# Patient Record
Sex: Female | Born: 1945 | Race: Black or African American | Hispanic: No
Health system: Southern US, Community
[De-identification: ages and names within clinical notes are randomized; demographics above are authoritative.]

---

## 2018-04-03 ENCOUNTER — Inpatient Hospital Stay (HOSPITAL_COMMUNITY)
Admission: EM | Admit: 2018-04-03 | Discharge: 2018-05-19 | DRG: 957 | Disposition: E | Payer: Medicare Other | Attending: Student | Admitting: Student

## 2018-04-03 ENCOUNTER — Emergency Department (HOSPITAL_COMMUNITY): Payer: Medicare Other

## 2018-04-03 DIAGNOSIS — E876 Hypokalemia: Secondary | ICD-10-CM | POA: Diagnosis not present

## 2018-04-03 DIAGNOSIS — S82112A Displaced fracture of left tibial spine, initial encounter for closed fracture: Secondary | ICD-10-CM

## 2018-04-03 DIAGNOSIS — Z9289 Personal history of other medical treatment: Secondary | ICD-10-CM

## 2018-04-03 DIAGNOSIS — S42101A Fracture of unspecified part of scapula, right shoulder, initial encounter for closed fracture: Secondary | ICD-10-CM

## 2018-04-03 DIAGNOSIS — J155 Pneumonia due to Escherichia coli: Secondary | ICD-10-CM | POA: Diagnosis present

## 2018-04-03 DIAGNOSIS — J90 Pleural effusion, not elsewhere classified: Secondary | ICD-10-CM

## 2018-04-03 DIAGNOSIS — S2241XA Multiple fractures of ribs, right side, initial encounter for closed fracture: Secondary | ICD-10-CM

## 2018-04-03 DIAGNOSIS — R40242 Glasgow coma scale score 9-12, unspecified time: Secondary | ICD-10-CM | POA: Diagnosis not present

## 2018-04-03 DIAGNOSIS — S52502A Unspecified fracture of the lower end of left radius, initial encounter for closed fracture: Secondary | ICD-10-CM | POA: Diagnosis present

## 2018-04-03 DIAGNOSIS — S066X9A Traumatic subarachnoid hemorrhage with loss of consciousness of unspecified duration, initial encounter: Secondary | ICD-10-CM | POA: Diagnosis not present

## 2018-04-03 DIAGNOSIS — R739 Hyperglycemia, unspecified: Secondary | ICD-10-CM | POA: Diagnosis present

## 2018-04-03 DIAGNOSIS — S2243XA Multiple fractures of ribs, bilateral, initial encounter for closed fracture: Secondary | ICD-10-CM

## 2018-04-03 DIAGNOSIS — G960 Cerebrospinal fluid leak: Secondary | ICD-10-CM | POA: Diagnosis not present

## 2018-04-03 DIAGNOSIS — Z9911 Dependence on respirator [ventilator] status: Secondary | ICD-10-CM

## 2018-04-03 DIAGNOSIS — T884XXA Failed or difficult intubation, initial encounter: Secondary | ICD-10-CM

## 2018-04-03 DIAGNOSIS — S82142A Displaced bicondylar fracture of left tibia, initial encounter for closed fracture: Secondary | ICD-10-CM

## 2018-04-03 DIAGNOSIS — I609 Nontraumatic subarachnoid hemorrhage, unspecified: Secondary | ICD-10-CM

## 2018-04-03 DIAGNOSIS — S12100A Unspecified displaced fracture of second cervical vertebra, initial encounter for closed fracture: Secondary | ICD-10-CM | POA: Diagnosis present

## 2018-04-03 DIAGNOSIS — S82141A Displaced bicondylar fracture of right tibia, initial encounter for closed fracture: Secondary | ICD-10-CM

## 2018-04-03 DIAGNOSIS — F209 Schizophrenia, unspecified: Secondary | ICD-10-CM | POA: Diagnosis present

## 2018-04-03 DIAGNOSIS — R402212 Coma scale, best verbal response, none, at arrival to emergency department: Secondary | ICD-10-CM | POA: Diagnosis present

## 2018-04-03 DIAGNOSIS — J942 Hemothorax: Secondary | ICD-10-CM

## 2018-04-03 DIAGNOSIS — E870 Hyperosmolality and hypernatremia: Secondary | ICD-10-CM | POA: Diagnosis not present

## 2018-04-03 DIAGNOSIS — J9601 Acute respiratory failure with hypoxia: Secondary | ICD-10-CM | POA: Diagnosis present

## 2018-04-03 DIAGNOSIS — R402312 Coma scale, best motor response, none, at arrival to emergency department: Secondary | ICD-10-CM | POA: Diagnosis present

## 2018-04-03 DIAGNOSIS — T148XXA Other injury of unspecified body region, initial encounter: Secondary | ICD-10-CM

## 2018-04-03 DIAGNOSIS — Z66 Do not resuscitate: Secondary | ICD-10-CM | POA: Diagnosis not present

## 2018-04-03 DIAGNOSIS — Z515 Encounter for palliative care: Secondary | ICD-10-CM | POA: Diagnosis not present

## 2018-04-03 DIAGNOSIS — D62 Acute posthemorrhagic anemia: Secondary | ICD-10-CM | POA: Diagnosis present

## 2018-04-03 DIAGNOSIS — S52202A Unspecified fracture of shaft of left ulna, initial encounter for closed fracture: Secondary | ICD-10-CM

## 2018-04-03 DIAGNOSIS — I10 Essential (primary) hypertension: Secondary | ICD-10-CM | POA: Diagnosis present

## 2018-04-03 DIAGNOSIS — S82841A Displaced bimalleolar fracture of right lower leg, initial encounter for closed fracture: Secondary | ICD-10-CM

## 2018-04-03 DIAGNOSIS — I6529 Occlusion and stenosis of unspecified carotid artery: Secondary | ICD-10-CM | POA: Diagnosis present

## 2018-04-03 DIAGNOSIS — Z978 Presence of other specified devices: Secondary | ICD-10-CM

## 2018-04-03 DIAGNOSIS — R402112 Coma scale, eyes open, never, at arrival to emergency department: Secondary | ICD-10-CM | POA: Diagnosis present

## 2018-04-03 DIAGNOSIS — S272XXA Traumatic hemopneumothorax, initial encounter: Secondary | ICD-10-CM | POA: Diagnosis present

## 2018-04-03 DIAGNOSIS — S12000A Unspecified displaced fracture of first cervical vertebra, initial encounter for closed fracture: Secondary | ICD-10-CM

## 2018-04-03 DIAGNOSIS — D696 Thrombocytopenia, unspecified: Secondary | ICD-10-CM | POA: Diagnosis present

## 2018-04-03 DIAGNOSIS — Z0189 Encounter for other specified special examinations: Secondary | ICD-10-CM

## 2018-04-03 DIAGNOSIS — S52612A Displaced fracture of left ulna styloid process, initial encounter for closed fracture: Secondary | ICD-10-CM | POA: Diagnosis present

## 2018-04-03 DIAGNOSIS — Z452 Encounter for adjustment and management of vascular access device: Secondary | ICD-10-CM

## 2018-04-03 DIAGNOSIS — S82152A Displaced fracture of left tibial tuberosity, initial encounter for closed fracture: Secondary | ICD-10-CM | POA: Diagnosis present

## 2018-04-03 DIAGNOSIS — R339 Retention of urine, unspecified: Secondary | ICD-10-CM | POA: Diagnosis not present

## 2018-04-03 DIAGNOSIS — S0181XA Laceration without foreign body of other part of head, initial encounter: Secondary | ICD-10-CM | POA: Diagnosis present

## 2018-04-03 DIAGNOSIS — D734 Cyst of spleen: Secondary | ICD-10-CM | POA: Diagnosis present

## 2018-04-03 DIAGNOSIS — L8990 Pressure ulcer of unspecified site, unspecified stage: Secondary | ICD-10-CM

## 2018-04-03 DIAGNOSIS — Z419 Encounter for procedure for purposes other than remedying health state, unspecified: Secondary | ICD-10-CM

## 2018-04-03 DIAGNOSIS — S0101XA Laceration without foreign body of scalp, initial encounter: Secondary | ICD-10-CM | POA: Diagnosis present

## 2018-04-03 DIAGNOSIS — J9690 Respiratory failure, unspecified, unspecified whether with hypoxia or hypercapnia: Secondary | ICD-10-CM

## 2018-04-03 DIAGNOSIS — J918 Pleural effusion in other conditions classified elsewhere: Secondary | ICD-10-CM | POA: Diagnosis not present

## 2018-04-03 DIAGNOSIS — S065X9A Traumatic subdural hemorrhage with loss of consciousness of unspecified duration, initial encounter: Secondary | ICD-10-CM | POA: Diagnosis present

## 2018-04-03 DIAGNOSIS — S62605A Fracture of unspecified phalanx of left ring finger, initial encounter for closed fracture: Secondary | ICD-10-CM | POA: Diagnosis present

## 2018-04-03 DIAGNOSIS — Z23 Encounter for immunization: Secondary | ICD-10-CM

## 2018-04-03 LAB — CBC
HCT: 32.3 % — ABNORMAL LOW (ref 36.0–46.0)
Hemoglobin: 9.5 g/dL — ABNORMAL LOW (ref 12.0–15.0)
MCH: 27.1 pg (ref 26.0–34.0)
MCHC: 29.4 g/dL — ABNORMAL LOW (ref 30.0–36.0)
MCV: 92.3 fL (ref 80.0–100.0)
Platelets: 220 10*3/uL (ref 150–400)
RBC: 3.5 MIL/uL — ABNORMAL LOW (ref 3.87–5.11)
RDW: 15.5 % (ref 11.5–15.5)
WBC: 16.1 10*3/uL — ABNORMAL HIGH (ref 4.0–10.5)
nRBC: 0 % (ref 0.0–0.2)

## 2018-04-03 LAB — I-STAT CHEM 8, ED
BUN: 24 mg/dL — AB (ref 8–23)
Calcium, Ion: 1.06 mmol/L — ABNORMAL LOW (ref 1.15–1.40)
Chloride: 110 mmol/L (ref 98–111)
Creatinine, Ser: 1.5 mg/dL — ABNORMAL HIGH (ref 0.44–1.00)
GLUCOSE: 162 mg/dL — AB (ref 70–99)
HCT: 30 % — ABNORMAL LOW (ref 36.0–46.0)
Hemoglobin: 10.2 g/dL — ABNORMAL LOW (ref 12.0–15.0)
Potassium: 3.5 mmol/L (ref 3.5–5.1)
Sodium: 143 mmol/L (ref 135–145)
TCO2: 21 mmol/L — ABNORMAL LOW (ref 22–32)

## 2018-04-03 LAB — ABO/RH: ABO/RH(D): O POS

## 2018-04-03 LAB — I-STAT CG4 LACTIC ACID, ED: Lactic Acid, Venous: 3.79 mmol/L (ref 0.5–1.9)

## 2018-04-03 LAB — PROTIME-INR
INR: 1.27
Prothrombin Time: 15.7 seconds — ABNORMAL HIGH (ref 11.4–15.2)

## 2018-04-03 MED ORDER — FENTANYL CITRATE (PF) 100 MCG/2ML IJ SOLN
INTRAMUSCULAR | Status: AC | PRN
  Administered 2018-04-03: 100 ug via INTRAVENOUS

## 2018-04-03 MED ORDER — ETOMIDATE 2 MG/ML IV SOLN
INTRAVENOUS | Status: AC | PRN
  Administered 2018-04-03: 30 mg via INTRAVENOUS

## 2018-04-03 MED ORDER — SODIUM CHLORIDE 0.9 % IV SOLN
INTRAVENOUS | Status: AC | PRN
  Administered 2018-04-03: 1000 mL via INTRAVENOUS

## 2018-04-03 MED ORDER — SUCCINYLCHOLINE CHLORIDE 20 MG/ML IJ SOLN
INTRAMUSCULAR | Status: AC | PRN
  Administered 2018-04-03: 150 mg via INTRAVENOUS

## 2018-04-03 MED ORDER — FENTANYL CITRATE (PF) 100 MCG/2ML IJ SOLN
INTRAMUSCULAR | Status: AC
  Filled 2018-04-03: qty 4

## 2018-04-03 MED ORDER — IOHEXOL 300 MG/ML  SOLN
100.0000 mL | Freq: Once | INTRAMUSCULAR | Status: AC | PRN
  Administered 2018-04-03: 100 mL via INTRAVENOUS

## 2018-04-03 MED ORDER — TETANUS-DIPHTH-ACELL PERTUSSIS 5-2.5-18.5 LF-MCG/0.5 IM SUSP
0.5000 mL | Freq: Once | INTRAMUSCULAR | Status: AC
  Administered 2018-04-04: 0.5 mL via INTRAMUSCULAR
  Filled 2018-04-03: qty 0.5

## 2018-04-03 MED ORDER — PROPOFOL 1000 MG/100ML IV EMUL
INTRAVENOUS | Status: AC | PRN
  Administered 2018-04-03: 20 ug/kg/min via INTRAVENOUS

## 2018-04-03 MED ORDER — MIDAZOLAM HCL 5 MG/5ML IJ SOLN
INTRAMUSCULAR | Status: AC | PRN
  Administered 2018-04-03: 2 mg via INTRAVENOUS

## 2018-04-03 MED ORDER — MIDAZOLAM HCL 2 MG/2ML IJ SOLN
INTRAMUSCULAR | Status: AC
  Filled 2018-04-03: qty 4

## 2018-04-03 MED ORDER — PROPOFOL 1000 MG/100ML IV EMUL
INTRAVENOUS | Status: AC
  Filled 2018-04-03: qty 100

## 2018-04-03 NOTE — Procedures (Signed)
Central Venous Catheter Insertion Procedure Note Renee [name] [id number] 21-Nov-1945  Procedure: Insertion of Central Venous Catheter Indications: emergent IV access  Procedure Details Consent: emergency Time Out: Verified patient identification, verified procedure, site/side was marked, verified correct patient position, special equipment/implants available, medications/allergies/relevent history reviewed, required imaging and test results available.  Performed  Maximum sterile technique was used including antiseptics and gloves. Skin prep: Chlorhexidine; local anesthetic administered A antimicrobial bonded/coated triple lumen catheter was placed in the right femoral vein due to emergent situation using the Seldinger technique.  Evaluation Blood flow good Complications: No apparent complications Patient did tolerate procedure well. Chest X-ray ordered to verify placement.  CXR: not needed.  Axel Filler 03/28/2018, 11:18 PM
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
## 2018-04-03 NOTE — ED Notes (Signed)
Notified Dr. Little of elevated lactic acid 

## 2018-04-04 ENCOUNTER — Emergency Department (HOSPITAL_COMMUNITY): Payer: Medicare Other

## 2018-04-04 ENCOUNTER — Inpatient Hospital Stay: Payer: Self-pay

## 2018-04-04 ENCOUNTER — Inpatient Hospital Stay (HOSPITAL_COMMUNITY): Payer: Medicare Other

## 2018-04-04 ENCOUNTER — Encounter (HOSPITAL_COMMUNITY): Payer: Self-pay | Admitting: Emergency Medicine

## 2018-04-04 DIAGNOSIS — R402312 Coma scale, best motor response, none, at arrival to emergency department: Secondary | ICD-10-CM | POA: Diagnosis present

## 2018-04-04 DIAGNOSIS — S52612A Displaced fracture of left ulna styloid process, initial encounter for closed fracture: Secondary | ICD-10-CM | POA: Diagnosis present

## 2018-04-04 DIAGNOSIS — S272XXA Traumatic hemopneumothorax, initial encounter: Secondary | ICD-10-CM | POA: Diagnosis present

## 2018-04-04 DIAGNOSIS — Z9911 Dependence on respirator [ventilator] status: Secondary | ICD-10-CM | POA: Diagnosis not present

## 2018-04-04 DIAGNOSIS — S82152A Displaced fracture of left tibial tuberosity, initial encounter for closed fracture: Secondary | ICD-10-CM | POA: Diagnosis present

## 2018-04-04 DIAGNOSIS — E870 Hyperosmolality and hypernatremia: Secondary | ICD-10-CM | POA: Diagnosis not present

## 2018-04-04 DIAGNOSIS — S82142A Displaced bicondylar fracture of left tibia, initial encounter for closed fracture: Secondary | ICD-10-CM | POA: Diagnosis present

## 2018-04-04 DIAGNOSIS — S12000A Unspecified displaced fracture of first cervical vertebra, initial encounter for closed fracture: Secondary | ICD-10-CM | POA: Diagnosis present

## 2018-04-04 DIAGNOSIS — R402112 Coma scale, eyes open, never, at arrival to emergency department: Secondary | ICD-10-CM | POA: Diagnosis present

## 2018-04-04 DIAGNOSIS — J918 Pleural effusion in other conditions classified elsewhere: Secondary | ICD-10-CM | POA: Diagnosis not present

## 2018-04-04 DIAGNOSIS — S065X9A Traumatic subdural hemorrhage with loss of consciousness of unspecified duration, initial encounter: Secondary | ICD-10-CM | POA: Diagnosis present

## 2018-04-04 DIAGNOSIS — S12100A Unspecified displaced fracture of second cervical vertebra, initial encounter for closed fracture: Secondary | ICD-10-CM | POA: Diagnosis present

## 2018-04-04 DIAGNOSIS — G960 Cerebrospinal fluid leak: Secondary | ICD-10-CM | POA: Diagnosis not present

## 2018-04-04 DIAGNOSIS — J155 Pneumonia due to Escherichia coli: Secondary | ICD-10-CM | POA: Diagnosis present

## 2018-04-04 DIAGNOSIS — S52202A Unspecified fracture of shaft of left ulna, initial encounter for closed fracture: Secondary | ICD-10-CM | POA: Diagnosis present

## 2018-04-04 DIAGNOSIS — S066X9A Traumatic subarachnoid hemorrhage with loss of consciousness of unspecified duration, initial encounter: Secondary | ICD-10-CM | POA: Diagnosis present

## 2018-04-04 DIAGNOSIS — J9601 Acute respiratory failure with hypoxia: Secondary | ICD-10-CM | POA: Diagnosis present

## 2018-04-04 DIAGNOSIS — S2243XA Multiple fractures of ribs, bilateral, initial encounter for closed fracture: Secondary | ICD-10-CM | POA: Diagnosis present

## 2018-04-04 DIAGNOSIS — Z515 Encounter for palliative care: Secondary | ICD-10-CM | POA: Diagnosis not present

## 2018-04-04 DIAGNOSIS — Z66 Do not resuscitate: Secondary | ICD-10-CM | POA: Diagnosis not present

## 2018-04-04 DIAGNOSIS — S82112A Displaced fracture of left tibial spine, initial encounter for closed fracture: Secondary | ICD-10-CM | POA: Diagnosis present

## 2018-04-04 DIAGNOSIS — D62 Acute posthemorrhagic anemia: Secondary | ICD-10-CM | POA: Diagnosis present

## 2018-04-04 DIAGNOSIS — Z23 Encounter for immunization: Secondary | ICD-10-CM | POA: Diagnosis present

## 2018-04-04 DIAGNOSIS — S52502A Unspecified fracture of the lower end of left radius, initial encounter for closed fracture: Secondary | ICD-10-CM | POA: Diagnosis present

## 2018-04-04 LAB — BPAM FFP
Blood Product Expiration Date: 202001262359
Blood Product Expiration Date: 202001272359
Blood Product Expiration Date: 202001282359
Blood Product Expiration Date: 202001282359
ISSUE DATE / TIME: 202001152304
ISSUE DATE / TIME: 202001152304
ISSUE DATE / TIME: 202001152222
ISSUE DATE / TIME: 202001152222
UNIT TYPE AND RH: 6200
Unit Type and Rh: 600
Unit Type and Rh: 6200
Unit Type and Rh: 6200

## 2018-04-04 LAB — CBC
HCT: 32.2 % — ABNORMAL LOW (ref 36.0–46.0)
HCT: 29.4 % — ABNORMAL LOW (ref 36.0–46.0)
Hemoglobin: 10.1 g/dL — ABNORMAL LOW (ref 12.0–15.0)
Hemoglobin: 9.6 g/dL — ABNORMAL LOW (ref 12.0–15.0)
MCH: 27.8 pg (ref 26.0–34.0)
MCH: 28.5 pg (ref 26.0–34.0)
MCHC: 31.4 g/dL (ref 30.0–36.0)
MCHC: 32.7 g/dL (ref 30.0–36.0)
MCV: 88.7 fL (ref 80.0–100.0)
MCV: 87.2 fL (ref 80.0–100.0)
PLATELETS: 135 10*3/uL — AB (ref 150–400)
Platelets: 123 10*3/uL — ABNORMAL LOW (ref 150–400)
RBC: 3.63 MIL/uL — AB (ref 3.87–5.11)
RBC: 3.37 MIL/uL — ABNORMAL LOW (ref 3.87–5.11)
RDW: 15.6 % — ABNORMAL HIGH (ref 11.5–15.5)
RDW: 16.1 % — ABNORMAL HIGH (ref 11.5–15.5)
WBC: 8.7 10*3/uL (ref 4.0–10.5)
WBC: 13.8 10*3/uL — ABNORMAL HIGH (ref 4.0–10.5)
nRBC: 0 % (ref 0.0–0.2)
nRBC: 0 % (ref 0.0–0.2)

## 2018-04-04 LAB — PREPARE FRESH FROZEN PLASMA
Unit division: 0
Unit division: 0
Unit division: 0
Unit division: 0

## 2018-04-04 LAB — COMPREHENSIVE METABOLIC PANEL
ALBUMIN: 2.8 g/dL — AB (ref 3.5–5.0)
ALT: 90 U/L — ABNORMAL HIGH (ref 0–44)
AST: 142 U/L — ABNORMAL HIGH (ref 15–41)
Alkaline Phosphatase: 59 U/L (ref 38–126)
Anion gap: 12 (ref 5–15)
BUN: 23 mg/dL (ref 8–23)
CO2: 20 mmol/L — AB (ref 22–32)
Calcium: 7.9 mg/dL — ABNORMAL LOW (ref 8.9–10.3)
Chloride: 110 mmol/L (ref 98–111)
Creatinine, Ser: 1.24 mg/dL — ABNORMAL HIGH (ref 0.44–1.00)
GFR calc Af Amer: 50 mL/min — ABNORMAL LOW (ref >60)
GFR calc non Af Amer: 43 mL/min — ABNORMAL LOW (ref >60)
Glucose, Bld: 172 mg/dL — ABNORMAL HIGH (ref 70–99)
Potassium: 3.7 mmol/L (ref 3.5–5.1)
SODIUM: 142 mmol/L (ref 135–145)
Total Bilirubin: 0.5 mg/dL (ref 0.3–1.2)
Total Protein: 5.8 g/dL — ABNORMAL LOW (ref 6.5–8.1)

## 2018-04-04 LAB — BASIC METABOLIC PANEL
Anion gap: 14 (ref 5–15)
BUN: 20 mg/dL (ref 8–23)
CHLORIDE: 115 mmol/L — AB (ref 98–111)
CO2: 18 mmol/L — ABNORMAL LOW (ref 22–32)
Calcium: 7.6 mg/dL — ABNORMAL LOW (ref 8.9–10.3)
Creatinine, Ser: 1.14 mg/dL — ABNORMAL HIGH (ref 0.44–1.00)
GFR calc Af Amer: 55 mL/min — ABNORMAL LOW (ref >60)
GFR calc non Af Amer: 48 mL/min — ABNORMAL LOW (ref >60)
Glucose, Bld: 182 mg/dL — ABNORMAL HIGH (ref 70–99)
POTASSIUM: 3 mmol/L — AB (ref 3.5–5.1)
Sodium: 147 mmol/L — ABNORMAL HIGH (ref 135–145)

## 2018-04-04 LAB — URINALYSIS, ROUTINE W REFLEX MICROSCOPIC
Bilirubin Urine: NEGATIVE
Glucose, UA: NEGATIVE mg/dL
Ketones, ur: NEGATIVE mg/dL
Nitrite: NEGATIVE
PROTEIN: 30 mg/dL — AB
Specific Gravity, Urine: 1.009 (ref 1.005–1.030)
WBC, UA: >50 WBC/hpf — ABNORMAL HIGH (ref 0–5)
pH: 6 (ref 5.0–8.0)

## 2018-04-04 LAB — GLUCOSE, CAPILLARY
GLUCOSE-CAPILLARY: 112 mg/dL — AB (ref 70–99)
GLUCOSE-CAPILLARY: 135 mg/dL — AB (ref 70–99)
Glucose-Capillary: 121 mg/dL — ABNORMAL HIGH (ref 70–99)
Glucose-Capillary: 121 mg/dL — ABNORMAL HIGH (ref 70–99)
Glucose-Capillary: 153 mg/dL — ABNORMAL HIGH (ref 70–99)
Glucose-Capillary: 143 mg/dL — ABNORMAL HIGH (ref 70–99)

## 2018-04-04 LAB — I-STAT ARTERIAL BLOOD GAS, ED
Acid-base deficit: 7 mmol/L — ABNORMAL HIGH (ref 0.0–2.0)
Bicarbonate: 20 mmol/L (ref 20.0–28.0)
O2 Saturation: 97 %
Patient temperature: 35.8
TCO2: 21 mmol/L — ABNORMAL LOW (ref 22–32)
pCO2 arterial: 44.3 mmHg (ref 32.0–48.0)
pH, Arterial: 7.256 — ABNORMAL LOW (ref 7.350–7.450)
pO2, Arterial: 95 mmHg (ref 83.0–108.0)

## 2018-04-04 LAB — CDS SEROLOGY

## 2018-04-04 LAB — MRSA PCR SCREENING: MRSA by PCR: POSITIVE — AB

## 2018-04-04 LAB — ETHANOL: Alcohol, Ethyl (B): 219 mg/dL — ABNORMAL HIGH (ref <10)

## 2018-04-04 LAB — TRIGLYCERIDES: Triglycerides: 181 mg/dL — ABNORMAL HIGH (ref <150)

## 2018-04-04 LAB — BLOOD PRODUCT ORDER (VERBAL) VERIFICATION

## 2018-04-04 MED ORDER — SODIUM CHLORIDE 0.9% FLUSH
10.0000 mL | Freq: Two times a day (BID) | INTRAVENOUS | Status: DC
  Administered 2018-04-04: 20 mL
  Administered 2018-04-05 – 2018-04-11 (×9): 10 mL
  Administered 2018-04-13: 20 mL
  Administered 2018-04-15 – 2018-04-17 (×3): 10 mL
  Administered 2018-04-18: 20 mL
  Administered 2018-04-18 – 2018-04-24 (×9): 10 mL
  Administered 2018-04-25 (×2): 20 mL

## 2018-04-04 MED ORDER — DEXTROSE-NACL 5-0.9 % IV SOLN
INTRAVENOUS | Status: DC
  Administered 2018-04-04: 04:00:00 via INTRAVENOUS

## 2018-04-04 MED ORDER — SODIUM CHLORIDE 0.9 % IV SOLN
INTRAVENOUS | Status: DC
  Administered 2018-04-04: 10:00:00 via INTRAVENOUS
  Filled 2018-04-04 (×4): qty 1000

## 2018-04-04 MED ORDER — CHLORHEXIDINE GLUCONATE CLOTH 2 % EX PADS: 6.0000 | MEDICATED_PAD | Freq: Every morning | CUTANEOUS | Status: DC

## 2018-04-04 MED ORDER — CHLORHEXIDINE GLUCONATE 0.12% ORAL RINSE (MEDLINE KIT)
15.0000 mL | Freq: Two times a day (BID) | OROMUCOSAL | Status: DC
  Administered 2018-04-04 – 2018-04-26 (×45): 15 mL via OROMUCOSAL

## 2018-04-04 MED ORDER — SODIUM CHLORIDE 0.9 % IV BOLUS
1000.0000 mL | Freq: Once | INTRAVENOUS | Status: AC
  Administered 2018-04-04: 1000 mL via INTRAVENOUS

## 2018-04-04 MED ORDER — ORAL CARE MOUTH RINSE
15.0000 mL | OROMUCOSAL | Status: DC
  Administered 2018-04-04 – 2018-04-26 (×220): 15 mL via OROMUCOSAL

## 2018-04-04 MED ORDER — ACETAMINOPHEN 160 MG/5ML PO SOLN
650.0000 mg | Freq: Four times a day (QID) | ORAL | Status: DC | PRN
  Administered 2018-04-04 – 2018-04-25 (×13): 650 mg
  Filled 2018-04-04 (×15): qty 20.3

## 2018-04-04 MED ORDER — VANCOMYCIN HCL IN DEXTROSE 1-5 GM/200ML-% IV SOLN
1000.0000 mg | INTRAVENOUS | Status: AC
  Administered 2018-04-05: 1000 mg via INTRAVENOUS
  Filled 2018-04-04: qty 200

## 2018-04-04 MED ORDER — SODIUM CHLORIDE 0.9 % IV SOLN
Freq: Once | INTRAVENOUS | Status: AC
  Administered 2018-04-04: 1000 mL via INTRAVENOUS

## 2018-04-04 MED ORDER — CEFAZOLIN SODIUM-DEXTROSE 1-4 GM/50ML-% IV SOLN
1.0000 g | Freq: Three times a day (TID) | INTRAVENOUS | Status: DC
  Administered 2018-04-04 – 2018-04-05 (×4): 1 g via INTRAVENOUS
  Administered 2018-04-05: 2 g via INTRAVENOUS
  Filled 2018-04-04 (×8): qty 50

## 2018-04-04 MED ORDER — METOPROLOL TARTRATE 5 MG/5ML IV SOLN
5.0000 mg | Freq: Four times a day (QID) | INTRAVENOUS | Status: DC
  Administered 2018-04-04 – 2018-04-08 (×16): 5 mg via INTRAVENOUS
  Filled 2018-04-04 (×15): qty 5

## 2018-04-04 MED ORDER — PROPOFOL 1000 MG/100ML IV EMUL
5.0000 ug/kg/min | INTRAVENOUS | Status: DC
  Administered 2018-04-04: 20 ug/kg/min via INTRAVENOUS
  Administered 2018-04-04: 15 ug/kg/min via INTRAVENOUS
  Administered 2018-04-04: 20 ug/kg/min via INTRAVENOUS
  Administered 2018-04-05: 25 ug/kg/min via INTRAVENOUS
  Administered 2018-04-05: 10 ug/kg/min via INTRAVENOUS
  Administered 2018-04-06: 25 ug/kg/min via INTRAVENOUS
  Filled 2018-04-04 (×5): qty 100

## 2018-04-04 MED ORDER — METOPROLOL TARTRATE 5 MG/5ML IV SOLN
5.0000 mg | Freq: Once | INTRAVENOUS | Status: AC
  Administered 2018-04-04: 5 mg via INTRAVENOUS
  Filled 2018-04-04: qty 5

## 2018-04-04 MED ORDER — SODIUM CHLORIDE 0.9% FLUSH: 10.0000 mL | INTRAVENOUS | Status: DC | PRN

## 2018-04-04 MED ORDER — CHLORHEXIDINE GLUCONATE CLOTH 2 % EX PADS
6.0000 | MEDICATED_PAD | Freq: Every day | CUTANEOUS | Status: DC
  Administered 2018-04-05 – 2018-04-26 (×24): 6 via TOPICAL

## 2018-04-04 MED ORDER — MUPIROCIN 2 % EX OINT
1.0000 "application " | TOPICAL_OINTMENT | Freq: Two times a day (BID) | CUTANEOUS | Status: AC
  Administered 2018-04-04 – 2018-04-08 (×10): 1 via NASAL
  Filled 2018-04-04: qty 22

## 2018-04-04 MED ORDER — POTASSIUM CHLORIDE 20 MEQ/15ML (10%) PO SOLN
40.0000 meq | Freq: Two times a day (BID) | ORAL | Status: AC
  Administered 2018-04-04 (×2): 40 meq
  Filled 2018-04-04 (×2): qty 30

## 2018-04-04 MED ORDER — POTASSIUM CHLORIDE IN NACL 20-0.9 MEQ/L-% IV SOLN
INTRAVENOUS | Status: DC
  Administered 2018-04-04 – 2018-04-07 (×6): via INTRAVENOUS
  Filled 2018-04-04 (×9): qty 1000

## 2018-04-04 MED ORDER — INSULIN ASPART 100 UNIT/ML ~~LOC~~ SOLN
0.0000 [IU] | SUBCUTANEOUS | Status: DC
  Administered 2018-04-04 (×2): 2 [IU] via SUBCUTANEOUS
  Administered 2018-04-04: 3 [IU] via SUBCUTANEOUS
  Administered 2018-04-05 – 2018-04-06 (×4): 2 [IU] via SUBCUTANEOUS
  Administered 2018-04-06: 5 [IU] via SUBCUTANEOUS
  Administered 2018-04-06 – 2018-04-07 (×2): 2 [IU] via SUBCUTANEOUS
  Administered 2018-04-07: 3 [IU] via SUBCUTANEOUS
  Administered 2018-04-07 – 2018-04-08 (×4): 2 [IU] via SUBCUTANEOUS
  Administered 2018-04-08: 3 [IU] via SUBCUTANEOUS
  Administered 2018-04-08: 2 [IU] via SUBCUTANEOUS
  Administered 2018-04-09: 3 [IU] via SUBCUTANEOUS
  Administered 2018-04-09: 2 [IU] via SUBCUTANEOUS
  Administered 2018-04-09: 3 [IU] via SUBCUTANEOUS
  Administered 2018-04-09 – 2018-04-10 (×5): 2 [IU] via SUBCUTANEOUS
  Administered 2018-04-10: 3 [IU] via SUBCUTANEOUS
  Administered 2018-04-10 – 2018-04-11 (×3): 2 [IU] via SUBCUTANEOUS
  Administered 2018-04-11 (×2): 3 [IU] via SUBCUTANEOUS
  Administered 2018-04-11: 2 [IU] via SUBCUTANEOUS
  Administered 2018-04-11 – 2018-04-12 (×2): 5 [IU] via SUBCUTANEOUS
  Administered 2018-04-12 (×2): 2 [IU] via SUBCUTANEOUS
  Administered 2018-04-12: 3 [IU] via SUBCUTANEOUS
  Administered 2018-04-12 – 2018-04-13 (×3): 2 [IU] via SUBCUTANEOUS
  Administered 2018-04-13 (×2): 3 [IU] via SUBCUTANEOUS
  Administered 2018-04-13: 2 [IU] via SUBCUTANEOUS
  Administered 2018-04-13: 3 [IU] via SUBCUTANEOUS
  Administered 2018-04-13 – 2018-04-14 (×3): 2 [IU] via SUBCUTANEOUS
  Administered 2018-04-14: 3 [IU] via SUBCUTANEOUS
  Administered 2018-04-14: 2 [IU] via SUBCUTANEOUS
  Administered 2018-04-14: 3 [IU] via SUBCUTANEOUS
  Administered 2018-04-14 – 2018-04-15 (×3): 2 [IU] via SUBCUTANEOUS
  Administered 2018-04-15 (×2): 3 [IU] via SUBCUTANEOUS
  Administered 2018-04-16 (×2): 2 [IU] via SUBCUTANEOUS
  Administered 2018-04-16: 5 [IU] via SUBCUTANEOUS
  Administered 2018-04-16: 2 [IU] via SUBCUTANEOUS
  Administered 2018-04-16 – 2018-04-17 (×4): 3 [IU] via SUBCUTANEOUS
  Administered 2018-04-17: 2 [IU] via SUBCUTANEOUS
  Administered 2018-04-17 (×3): 3 [IU] via SUBCUTANEOUS
  Administered 2018-04-18: 2 [IU] via SUBCUTANEOUS
  Administered 2018-04-18 (×2): 3 [IU] via SUBCUTANEOUS
  Administered 2018-04-18: 2 [IU] via SUBCUTANEOUS
  Administered 2018-04-18: 3 [IU] via SUBCUTANEOUS
  Administered 2018-04-19 (×2): 2 [IU] via SUBCUTANEOUS
  Administered 2018-04-19 – 2018-04-20 (×5): 3 [IU] via SUBCUTANEOUS
  Administered 2018-04-20: 5 [IU] via SUBCUTANEOUS
  Administered 2018-04-21 (×3): 2 [IU] via SUBCUTANEOUS
  Administered 2018-04-21 (×2): 3 [IU] via SUBCUTANEOUS
  Administered 2018-04-22 (×2): 2 [IU] via SUBCUTANEOUS
  Administered 2018-04-22: 3 [IU] via SUBCUTANEOUS
  Administered 2018-04-22: 2 [IU] via SUBCUTANEOUS
  Administered 2018-04-22 (×2): 3 [IU] via SUBCUTANEOUS
  Administered 2018-04-23: 2 [IU] via SUBCUTANEOUS
  Administered 2018-04-23: 3 [IU] via SUBCUTANEOUS
  Administered 2018-04-23 (×2): 2 [IU] via SUBCUTANEOUS
  Administered 2018-04-23: 1 [IU] via SUBCUTANEOUS
  Administered 2018-04-24 (×2): 2 [IU] via SUBCUTANEOUS
  Administered 2018-04-25: 3 [IU] via SUBCUTANEOUS
  Administered 2018-04-25 – 2018-04-26 (×7): 2 [IU] via SUBCUTANEOUS

## 2018-04-04 NOTE — ED Notes (Signed)
Family updated as to patient's status.
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
## 2018-04-04 NOTE — Progress Notes (Signed)
Peripherally Inserted Central Catheter/Midline Placement  The IV Nurse has discussed with the patient and/or persons authorized to consent for the patient, the purpose of this procedure and the potential benefits and risks involved with this procedure.  The benefits include less needle sticks, lab draws from the catheter, and the patient may be discharged home with the catheter. Risks include, but not limited to, infection, bleeding, blood clot (thrombus formation), and puncture of an artery; nerve damage and irregular heartbeat and possibility to perform a PICC exchange if needed/ordered by physician.  Alternatives to this procedure were also discussed.  Bard Power PICC patient education guide, fact sheet on infection prevention and patient information card has been provided to patient /or left at bedside.    PICC/Midline Placement Documentation  PICC Double Lumen 04/04/18 PICC Right Basilic 48 cm 3 cm (Active)  Indication for Insertion or Continuance of Line Poor Vasculature-patient has had multiple peripheral attempts or PIVs lasting less than 24 hours 04/04/2018  2:00 PM  Exposed Catheter (cm) 3 cm 04/04/2018  2:00 PM  Site Assessment Clean;Dry;Intact 04/04/2018  2:00 PM  Lumen #1 Status Flushed;Blood return noted 04/04/2018  2:00 PM  Lumen #2 Status Flushed;Blood return noted 04/04/2018  2:00 PM  Dressing Type Transparent 04/04/2018  2:00 PM  Dressing Status Clean;Dry;Intact;Antimicrobial disc in place 04/04/2018  2:00 PM  Dressing Change Due 04/11/18 04/04/2018  2:00 PM       Stacie Glaze Horton 04/04/2018, 2:44 PM

## 2018-04-04 NOTE — Progress Notes (Signed)
Patient ID: Renee [name], female   DOB: 01/10/1946, 73 y.o.   MRN: [id number] Follow up - Trauma Critical Care  Patient Details:    Renee [name] is an 73 y.o. female.  Lines/tubes : Airway 7.5 mm (Active)  Secured at (cm) 25 cm Apr 15, 2018 10:45 PM  Measured From Teeth 15-Apr-2018 10:45 PM  Secured Location Center 2018-04-15 10:45 PM  Secured By [employer] 2018-04-15 10:45 PM  Site Condition Cool;Dry 2018-04-15 10:45 PM     Airway 7.5 mm (Active)  Secured at (cm) 26 cm 04/04/2018  3:38 AM  Measured From Lips 04/04/2018  3:38 AM  Secured Location Left 04/04/2018  3:38 AM  Secured By [employer] 04/04/2018  3:38 AM  Tube Holder Repositioned Yes 04/04/2018  3:38 AM  Cuff Pressure (cm H2O) 26 cm H2O 04/04/2018  3:38 AM  Site Condition Dry 04/04/2018  3:38 AM     CVC Triple Lumen 04/15/2018 Right Femoral (Active)  Indication for Insertion or Continuance of Line Poor Vasculature-patient has had multiple peripheral attempts or PIVs lasting less than 24 hours 04/04/2018  7:37 AM  Site Assessment Clean;Dry;Intact 04/04/2018  3:00 AM  Proximal Lumen Status Flushed;Infusing 04/04/2018  3:00 AM  Medial Lumen Status Flushed;Infusing 04/04/2018  3:00 AM  Distal Lumen Status Flushed;Saline locked 04/04/2018  3:00 AM  Dressing Type Transparent 04/04/2018  3:00 AM  Dressing Status Clean;Dry;Intact 04/04/2018  3:00 AM  Line Care Connections checked and tightened 04/04/2018  6:00 AM  Dressing Intervention Dressing changed;Antimicrobial disc changed 04/04/2018  6:00 AM  Dressing Change Due 04/11/18 04/04/2018  6:00 AM     NG/OG Tube Orogastric 18 Fr. Right mouth Xray (Active)  Site Assessment Clean;Dry;Intact 04/04/2018  3:00 AM  Ongoing Placement Verification No change in cm markings or external length of tube from initial placement;No change in respiratory status;No acute changes, not attributed to clinical condition 04/04/2018  3:00 AM  Status Clamped 04/04/2018  3:00 AM     Urethral  Catheter Rodman Pickle, EMT Latex;Temperature probe;Straight-tip 14 Fr. (Active)  Indication for Insertion or Continuance of Catheter Unstable critical patients (first 24-48 hours) 04/04/2018  7:38 AM  Site Assessment Clean;Intact 04/04/2018  3:00 AM  Catheter Maintenance Bag below level of bladder;Catheter secured;Drainage bag/tubing not touching floor;Insertion date on drainage bag;No dependent loops;Seal intact 04/04/2018  7:38 AM  Collection Container Standard drainage bag 04/04/2018  3:00 AM  Securement Method Securing device (Describe) 04/04/2018  3:00 AM  Urinary Catheter Interventions Unclamped 04/04/2018  3:00 AM  Output (mL) 200 mL 04/04/2018  6:00 AM    Microbiology/Sepsis markers: Results for orders placed or performed during the hospital encounter of 15-Apr-2018  MRSA PCR Screening     Status: Abnormal   Collection Time: 04/04/18  3:18 AM  Result Value Ref Range Status   MRSA by PCR POSITIVE (A) NEGATIVE Final    Comment:        The GeneXpert MRSA Assay (FDA approved for NASAL specimens only), is one component of a comprehensive MRSA colonization surveillance program. It is not intended to diagnose MRSA infection nor to guide or monitor treatment for MRSA infections. RESULT CALLED TO, READ BACK BY AND VERIFIED WITH: [name] RN [id number] 04/04/18 [name] Performed at [facility], [address] [place], [place] [address]     Anti-infectives:  Anti-infectives (From admission, onward)   None      Consults: Treatment Team:  Samson Frederic, MD Tia Alert, MD Haddix, Gillie Manners, MD  Studies:    Events:  Subjective:    Overnight Issues:   Objective:  Vital signs for last 24 hours: Temp:  [95.7 F (35.4 C)-101.5 F (38.6 C)] 101.5 F (38.6 C) (01/16 0700) Pulse Rate:  [75-125] 100 (01/16 0700) Resp:  [9-27] 19 (01/16 0700) BP: (73-184)/(39-98) 147/62 (01/16 0700) SpO2:  [62 %-100 %] 100 % (01/16 0700) FiO2 (%):  [60 %-80 %] 60 % (01/16 0338) Weight:   [81.6 kg] 81.6 kg (01/15 2330)  Hemodynamic parameters for last 24 hours:    Intake/Output from previous day: 01/15 0701 - 01/16 0700 In: 5427.4 [I.V.:3181.1; Blood:1233; IV Piggyback:721.3] Out: 1100 [Urine:1100]  Intake/Output this shift: No intake/output data recorded.  Vent settings for last 24 hours: Vent Mode: PRVC FiO2 (%):  [60 %-80 %] 60 % Set Rate:  [16 bmp] 16 bmp Vt Set:  [550 mL] 550 mL PEEP:  [5 cmH20] 5 cmH20 Plateau Pressure:  [14 cmH20-15 cmH20] 14 cmH20  Physical Exam:  General: on vent Neuro: PERL, follows some commands HEENT/Neck: ETT and collar Resp: clear to auscultation bilaterally CVS: RRR GI: soft, nontender, BS WNL, no r/g Extremities: BLE KI  Results for orders placed or performed during the hospital encounter of 03/25/2018 (from the past 24 hour(s))  Prepare fresh frozen plasma     Status: None (Preliminary result)   Collection Time: 03/20/2018 10:21 PM  Result Value Ref Range   Unit Number [id number]    Blood Component Type LIQ PLASMA    Unit division 00    Status of Unit ISSUED    Unit tag comment EMERGENCY RELEASE LITTLE    Transfusion Status OK TO TRANSFUSE    Unit Number [id number]    Blood Component Type LIQ PLASMA    Unit division 00    Status of Unit ISSUED    Unit tag comment EMERGENCY RELEASE LITTLE    Transfusion Status OK TO TRANSFUSE    Unit Number [id number]    Blood Component Type LIQ PLASMA    Unit division 00    Status of Unit REL FROM [facility]    Transfusion Status OK TO TRANSFUSE    Unit Number [id number]    Blood Component Type LIQ PLASMA    Unit division 00    Status of Unit REL FROM [facility]    Transfusion Status OK TO TRANSFUSE   CDS serology     Status: None   Collection Time: 03/25/2018 10:36 PM  Result Value Ref Range   CDS serology specimen      SPECIMEN WILL BE HELD FOR 14 DAYS IF TESTING IS REQUIRED  Comprehensive metabolic panel     Status: Abnormal   Collection Time: 04/09/2018 10:36 PM  Result  Value Ref Range   Sodium 142 135 - 145 mmol/L   Potassium 3.7 3.5 - 5.1 mmol/L   Chloride 110 98 - 111 mmol/L   CO2 20 (L) 22 - 32 mmol/L   Glucose, Bld 172 (H) 70 - 99 mg/dL   BUN 23 8 - 23 mg/dL   Creatinine, Ser 7.25 (H) 0.44 - 1.00 mg/dL   Calcium 7.9 (L) 8.9 - 10.3 mg/dL   Total Protein 5.8 (L) 6.5 - 8.1 g/dL   Albumin 2.8 (L) 3.5 - 5.0 g/dL   AST 366 (H) 15 - 41 U/L   ALT 90 (H) 0 - 44 U/L   Alkaline Phosphatase 59 38 - 126 U/L   Total Bilirubin 0.5 0.3 - 1.2 mg/dL   GFR calc non Af Denyse Dago  43 (L) >60 mL/min   GFR calc Af Amer 50 (L) >60 mL/min   Anion gap 12 5 - 15  CBC     Status: Abnormal   Collection Time: 03/31/2018 10:36 PM  Result Value Ref Range   WBC 16.1 (H) 4.0 - 10.5 K/uL   RBC 3.50 (L) 3.87 - 5.11 MIL/uL   Hemoglobin 9.5 (L) 12.0 - 15.0 g/dL   HCT 40.932.3 (L) 81.136.0 - 91.446.0 %   MCV 92.3 80.0 - 100.0 fL   MCH 27.1 26.0 - 34.0 pg   MCHC 29.4 (L) 30.0 - 36.0 g/dL   RDW 78.215.5 95.611.5 - 21.315.5 %   Platelets 220 150 - 400 K/uL   nRBC 0.0 0.0 - 0.2 %  Ethanol     Status: Abnormal   Collection Time: 03/21/2018 10:36 PM  Result Value Ref Range   Alcohol, Ethyl (B) 219 (H) <10 mg/dL  Protime-INR     Status: Abnormal   Collection Time: 04/13/2018 10:36 PM  Result Value Ref Range   Prothrombin Time 15.7 (H) 11.4 - 15.2 seconds   INR 1.27   Type and screen Ordered by PROVIDER DEFAULT     Status: None (Preliminary result)   Collection Time: 04/12/2018 10:44 PM  Result Value Ref Range   ABO/RH(D) O POS    Antibody Screen NEG    Sample Expiration 04/06/2018    Unit Number [id number]    Blood Component Type RBC LR PHER1    Unit division 00    Status of Unit ISSUED    Unit tag comment EMERGENCY RELEASE LITTLE    Transfusion Status OK TO TRANSFUSE    Crossmatch Result COMPATIBLE    Unit Number [id number]    Blood Component Type RBC LR PHER2    Unit division 00    Status of Unit ISSUED    Unit tag comment EMERGENCY RELEASE LITTLE    Transfusion Status OK TO TRANSFUSE     Crossmatch Result COMPATIBLE    Unit Number [id number]    Blood Component Type RBC LR PHER2    Unit division 00    Status of [facility]    Unit tag comment EMERGENCY RELEASE    Transfusion Status OK TO TRANSFUSE    Crossmatch Result COMPATIBLE    Unit Number [id number]    Blood Component Type RED CELLS,LR    Unit division 00    Status of Unit ISSUED    Unit tag comment EMERGENCY RELEASE    Transfusion Status OK TO TRANSFUSE    Crossmatch Result COMPATIBLE   ABO/Rh     Status: None   Collection Time: 03/24/2018 10:44 PM  Result Value Ref Range   ABO/RH(D)      O POS Performed at [facility], [address] [place], [place] [address]   I-Stat Chem 8, ED     Status: Abnormal   Collection Time: 04/07/2018 10:54 PM  Result Value Ref Range   Sodium 143 135 - 145 mmol/L   Potassium 3.5 3.5 - 5.1 mmol/L   Chloride 110 98 - 111 mmol/L   BUN 24 (H) 8 - 23 mg/dL   Creatinine, Ser 4.161.50 (H) 0.44 - 1.00 mg/dL   Glucose, Bld 606162 (H) 70 - 99 mg/dL   Calcium, Ion 3.011.06 (L) 1.15 - 1.40 mmol/L   TCO2 21 (L) 22 - 32 mmol/L   Hemoglobin 10.2 (L) 12.0 - 15.0 g/dL   HCT 60.130.0 (L) 09.336.0 - 23.546.0 %  I-Stat  CG4 Lactic Acid, ED     Status: Abnormal   Collection Time: 03/28/2018 10:55 PM  Result Value Ref Range   Lactic Acid, Venous 3.79 (HH) 0.5 - 1.9 mmol/L   Comment NOTIFIED PHYSICIAN   I-Stat arterial blood gas, ED     Status: Abnormal   Collection Time: 04/04/18 12:03 AM  Result Value Ref Range   pH, Arterial 7.256 (L) 7.350 - 7.450   pCO2 arterial 44.3 32.0 - 48.0 mmHg   pO2, Arterial 95.0 83.0 - 108.0 mmHg   Bicarbonate 20.0 20.0 - 28.0 mmol/L   TCO2 21 (L) 22 - 32 mmol/L   O2 Saturation 97.0 %   Acid-base deficit 7.0 (H) 0.0 - 2.0 mmol/L   Patient temperature 35.8 C    Collection site RADIAL, ALLEN'S TEST ACCEPTABLE    Drawn by Operator    Sample type ARTERIAL   Urinalysis, Routine w reflex microscopic     Status: Abnormal   Collection Time: 04/04/18 12:05 AM  Result  Value Ref Range   Color, Urine YELLOW YELLOW   APPearance CLOUDY (A) CLEAR   Specific Gravity, Urine 1.009 1.005 - 1.030   pH 6.0 5.0 - 8.0   Glucose, UA NEGATIVE NEGATIVE mg/dL   Hgb urine dipstick LARGE (A) NEGATIVE   Bilirubin Urine NEGATIVE NEGATIVE   Ketones, ur NEGATIVE NEGATIVE mg/dL   Protein, ur 30 (A) NEGATIVE mg/dL   Nitrite NEGATIVE NEGATIVE   Leukocytes, UA LARGE (A) NEGATIVE   RBC / HPF 21-50 0 - 5 RBC/hpf   WBC, UA >50 (H) 0 - 5 WBC/hpf   Bacteria, UA RARE (A) NONE SEEN   WBC Clumps PRESENT   MRSA PCR Screening     Status: Abnormal   Collection Time: 04/04/18  3:18 AM  Result Value Ref Range   MRSA by PCR POSITIVE (A) NEGATIVE  Glucose, capillary     Status: Abnormal   Collection Time: 04/04/18  3:23 AM  Result Value Ref Range   Glucose-Capillary 121 (H) 70 - 99 mg/dL  CBC     Status: Abnormal   Collection Time: 04/04/18  5:38 AM  Result Value Ref Range   WBC 8.7 4.0 - 10.5 K/uL   RBC 3.63 (L) 3.87 - 5.11 MIL/uL   Hemoglobin 10.1 (L) 12.0 - 15.0 g/dL   HCT 16.1 (L) 09.6 - 04.5 %   MCV 88.7 80.0 - 100.0 fL   MCH 27.8 26.0 - 34.0 pg   MCHC 31.4 30.0 - 36.0 g/dL   RDW 40.9 (H) 81.1 - 91.4 %   Platelets 135 (L) 150 - 400 K/uL   nRBC 0.0 0.0 - 0.2 %  Basic metabolic panel     Status: Abnormal   Collection Time: 04/04/18  5:38 AM  Result Value Ref Range   Sodium 147 (H) 135 - 145 mmol/L   Potassium 3.0 (L) 3.5 - 5.1 mmol/L   Chloride 115 (H) 98 - 111 mmol/L   CO2 18 (L) 22 - 32 mmol/L   Glucose, Bld 182 (H) 70 - 99 mg/dL   BUN 20 8 - 23 mg/dL   Creatinine, Ser 7.82 (H) 0.44 - 1.00 mg/dL   Calcium 7.6 (L) 8.9 - 10.3 mg/dL   GFR calc non Af Amer 48 (L) >60 mL/min   GFR calc Af Amer 55 (L) >60 mL/min   Anion gap 14 5 - 15  Glucose, capillary     Status: Abnormal   Collection Time: 04/04/18  7:48 AM  Result Value Ref  Range   Glucose-Capillary 121 (H) 70 - 99 mg/dL   Comment 1 Notify RN    Comment 2 Document in Chart     Assessment & Plan: Present on  Admission: **None**    LOS: 0 days   Additional comments:I reviewed the patient's new clinical lab test results. [facility] Dr. Yetta BarreJones, plan F/U CT head in AM C1-2 FXs - collar per Dr. Yetta BarreJones B rib FX (R 2-9; L2, 4-7) with small R PTX Acute hypoxic ventilator dependent respiratory failure - full support P neuro stability L ulna and 4th finger FX - per Dr. Orlan Leavensrtman R scapula FX - per Dr. Jena GaussHaddix B tibial plateau FX - to OR tomorrow with Dr. Jena GaussHaddix R fibula FX - per Dr. Jena GaussHaddix R bimal ankle FX - per Dr. Jena GaussHaddix Hyperglycemia - SSI, change IVF HX schizophrenia per family VTE - try plexipulse, no Lovenox yet with TBI FEN - replete hypokalemia Dispo - ICU   Critical Care Total Time*: 45 Minutes  Violeta GelinasBurke Emony Dormer, MD, MPH, FACS Trauma: [phone] General Surgery: [phone]  04/04/2018  *Care during the described time interval was provided by me. I have reviewed this patient's available data, including medical history, events of note, physical examination and test results as part of my evaluation.
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
## 2018-04-04 NOTE — Anesthesia Preprocedure Evaluation (Addendum)
Anesthesia Evaluation  General Assessment Comment:Pt Sedated and intubated  Reviewed: NPO status , Patient's Chart, lab work & pertinent test results, Unable to perform ROS - Chart review only  Airway Mallampati: Intubated  TM Distance: >3 FB Neck ROM: Full    Dental no notable dental hx.    Pulmonary    Pulmonary exam normal breath sounds clear to auscultation       Cardiovascular Normal cardiovascular exam Rhythm:Regular Rate:Normal     Neuro/Psych negative neurological ROS     GI/Hepatic negative GI ROS, Neg liver ROS, PUD: ],   Endo/Other  negative endocrine ROS  Renal/GU negative Renal ROS     Musculoskeletal   Abdominal   Peds  Hematology  (+) anemia , Hgb 8.6   Anesthesia Other Findings   Reproductive/Obstetrics                            Lab Results  Component Value Date   WBC 10.0 04/11/2018   HGB 8.6 (L) 03/29/2018   HCT 27.2 (L) 03/20/2018   MCV 88.6 03/24/2018   PLT 106 (L) 04/13/2018   Lab Results  Component Value Date   CREATININE 1.14 (H) 04/18/2018   BUN 17 03/25/2018   NA 145 03/31/2018   K 4.0 04/18/2018   CL 118 (H) 04/07/2018   CO2 17 (L) 04/15/2018     Anesthesia Physical Anesthesia Plan  ASA: III  Anesthesia Plan: General   Post-op Pain Management:    Induction: Intravenous  PONV Risk Score and Plan: 4 or greater and Treatment may vary due to age or medical condition, Ondansetron and Dexamethasone  Airway Management Planned: Oral ETT  Additional Equipment:   Intra-op Plan:   Post-operative Plan: Post-operative intubation/ventilation  Informed Consent:     Dental advisory given  Plan Discussed with: CRNA, Surgeon and Anesthesiologist  Anesthesia Plan Comments:         Anesthesia Quick Evaluation
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
## 2018-04-04 NOTE — Progress Notes (Signed)
Patient transported to and from CT without any apparent complications. 

## 2018-04-04 NOTE — ED Provider Notes (Addendum)
LACERATION REPAIR Performed by: Barkley Boards Consent: Emergency consult applied; patient is sedated and intubated Risks and benefits: risks, benefits and alternatives were not discussed; see above Patient identity confirmed: wristband Time out performed prior to procedure Wound explored Laceration Location: Scalp Laceration Length: 5 cm No Foreign Bodies seen or palpated Anesthesia: local infiltration Local anesthetic: None; Patient is sedated and intubated  Anesthetic total: 0 ml Irrigation method: Pressure Amount of cleaning: standard Skin closure: Staples Number of sutures or staples: 6 Technique: Staples Patient tolerance: Patient tolerated the procedure well with no immediate complications.  LACERATION REPAIR Performed by: Barkley Boards Consent: Emergency consult applied; patient is sedated and intubated Risks and benefits: risks, benefits and alternatives were not discussed; see above Patient identity confirmed: wristband Time out performed prior to procedure Wound explored Laceration Location: Right eye Laceration Length: 1.5 cm No Foreign Bodies seen or palpated Anesthesia: local infiltration Local anesthetic: N/A; Patient is sedated and intubated Anesthetic total: 0 ml Irrigation method: Pressure Amount of cleaning: standard Skin closure: Sutures - 6.0 Prolene Number of sutures or staples: 1 Technique: Horizontal mattress Patient tolerance: Patient tolerated the procedure well with no immediate complications.    Barkley Boards, PA-C 04/04/18 0150    Barkley Boards, PA-C 04/04/18 0152    Glynn Octave, MD 04/04/18 [phone]

## 2018-04-04 NOTE — Procedures (Signed)
Intubation Procedure Note Renee [name] [id number] [birth year]  Procedure: Intubation Indications: Airway protection and maintenance  Procedure Details Consent: Unable to obtain consent because of altered level of consciousness. Time Out: Verified patient identification, verified procedure, site/side was marked, verified correct patient position, special equipment/implants available, medications/allergies/relevent history reviewed, required imaging and test results available.  Performed 2242  Maximum sterile technique was used including gloves, gown and hand hygiene.  MAC and 3    Evaluation Hemodynamic Status: BP stable throughout; O2 sats: stable throughout Patient's Current Condition: stable Complications: No apparent complications Patient did tolerate procedure well. Chest X-ray ordered to verify placement.  CXR: tube position acceptable.   Milinda Cave 04/04/2018
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
## 2018-04-05 ENCOUNTER — Inpatient Hospital Stay (HOSPITAL_COMMUNITY): Payer: Medicare Other

## 2018-04-05 ENCOUNTER — Inpatient Hospital Stay (HOSPITAL_COMMUNITY): Payer: Medicare Other | Admitting: Certified Registered Nurse Anesthetist

## 2018-04-05 ENCOUNTER — Encounter (HOSPITAL_COMMUNITY): Admission: EM | Disposition: E | Payer: Self-pay | Source: Home / Self Care

## 2018-04-05 HISTORY — PX: ORIF TIBIA PLATEAU: SHX2132

## 2018-04-05 HISTORY — PX: ORIF ANKLE FRACTURE: SHX5408

## 2018-04-05 HISTORY — PX: ORIF WRIST FRACTURE: SHX2133

## 2018-04-05 LAB — BASIC METABOLIC PANEL
ANION GAP: 10 (ref 5–15)
BUN: 17 mg/dL (ref 8–23)
CO2: 17 mmol/L — ABNORMAL LOW (ref 22–32)
Calcium: 7.5 mg/dL — ABNORMAL LOW (ref 8.9–10.3)
Chloride: 118 mmol/L — ABNORMAL HIGH (ref 98–111)
Creatinine, Ser: 1.14 mg/dL — ABNORMAL HIGH (ref 0.44–1.00)
GFR calc Af Amer: 55 mL/min — ABNORMAL LOW (ref >60)
GFR calc non Af Amer: 48 mL/min — ABNORMAL LOW (ref >60)
Glucose, Bld: 132 mg/dL — ABNORMAL HIGH (ref 70–99)
Potassium: 4 mmol/L (ref 3.5–5.1)
Sodium: 145 mmol/L (ref 135–145)

## 2018-04-05 LAB — CBC
HCT: 27.2 % — ABNORMAL LOW (ref 36.0–46.0)
Hemoglobin: 8.6 g/dL — ABNORMAL LOW (ref 12.0–15.0)
MCH: 28 pg (ref 26.0–34.0)
MCHC: 31.6 g/dL (ref 30.0–36.0)
MCV: 88.6 fL (ref 80.0–100.0)
Platelets: 106 10*3/uL — ABNORMAL LOW (ref 150–400)
RBC: 3.07 MIL/uL — ABNORMAL LOW (ref 3.87–5.11)
RDW: 16.5 % — ABNORMAL HIGH (ref 11.5–15.5)
WBC: 10 10*3/uL (ref 4.0–10.5)
nRBC: 0 % (ref 0.0–0.2)

## 2018-04-05 LAB — POCT I-STAT 7, (LYTES, BLD GAS, ICA,H+H)
Acid-base deficit: 3 mmol/L — ABNORMAL HIGH (ref 0.0–2.0)
Bicarbonate: 22.5 mmol/L (ref 20.0–28.0)
Calcium, Ion: 0.96 mmol/L — ABNORMAL LOW (ref 1.15–1.40)
HCT: 21 % — ABNORMAL LOW (ref 36.0–46.0)
Hemoglobin: 7.1 g/dL — ABNORMAL LOW (ref 12.0–15.0)
O2 Saturation: 99 %
PO2 ART: 148 mmHg — AB (ref 83.0–108.0)
Patient temperature: 35.4
Potassium: 4.1 mmol/L (ref 3.5–5.1)
Sodium: 148 mmol/L — ABNORMAL HIGH (ref 135–145)
TCO2: 24 mmol/L (ref 22–32)
pCO2 arterial: 38.7 mmHg (ref 32.0–48.0)
pH, Arterial: 7.364 (ref 7.350–7.450)

## 2018-04-05 LAB — GLUCOSE, CAPILLARY
GLUCOSE-CAPILLARY: 114 mg/dL — AB (ref 70–99)
Glucose-Capillary: 143 mg/dL — ABNORMAL HIGH (ref 70–99)
Glucose-Capillary: 135 mg/dL — ABNORMAL HIGH (ref 70–99)
Glucose-Capillary: 127 mg/dL — ABNORMAL HIGH (ref 70–99)
Glucose-Capillary: 99 mg/dL (ref 70–99)

## 2018-04-05 LAB — URINE CULTURE: Culture: NO GROWTH

## 2018-04-05 LAB — POCT I-STAT EG7
Acid-base deficit: 14 mmol/L — ABNORMAL HIGH (ref 0.0–2.0)
Bicarbonate: 12.9 mmol/L — ABNORMAL LOW (ref 20.0–28.0)
Calcium, Ion: 0.75 mmol/L — CL (ref 1.15–1.40)
HEMATOCRIT: 19 % — AB (ref 36.0–46.0)
Hemoglobin: 6.5 g/dL — CL (ref 12.0–15.0)
O2 SAT: 88 %
Patient temperature: 35.6
Potassium: 3 mmol/L — ABNORMAL LOW (ref 3.5–5.1)
Sodium: 151 mmol/L — ABNORMAL HIGH (ref 135–145)
TCO2: 14 mmol/L — AB (ref 22–32)
pCO2, Ven: 31.1 mmHg — ABNORMAL LOW (ref 44.0–60.0)
pH, Ven: 7.219 — ABNORMAL LOW (ref 7.250–7.430)
pO2, Ven: 59 mmHg — ABNORMAL HIGH (ref 32.0–45.0)

## 2018-04-05 LAB — PREPARE RBC (CROSSMATCH)

## 2018-04-05 SURGERY — OPEN REDUCTION INTERNAL FIXATION (ORIF) TIBIAL PLATEAU
Anesthesia: General | Laterality: Right

## 2018-04-05 MED ORDER — ROCURONIUM BROMIDE 50 MG/5ML IV SOSY
PREFILLED_SYRINGE | INTRAVENOUS | Status: AC
  Filled 2018-04-05: qty 10

## 2018-04-05 MED ORDER — LACTATED RINGERS IV SOLN
INTRAVENOUS | Status: DC | PRN
  Administered 2018-04-05 (×2): via INTRAVENOUS

## 2018-04-05 MED ORDER — ALBUMIN HUMAN 5 % IV SOLN
INTRAVENOUS | Status: DC | PRN
  Administered 2018-04-05 (×2): via INTRAVENOUS

## 2018-04-05 MED ORDER — FENTANYL CITRATE (PF) 250 MCG/5ML IJ SOLN
INTRAMUSCULAR | Status: AC
  Filled 2018-04-05: qty 5

## 2018-04-05 MED ORDER — EPHEDRINE 5 MG/ML INJ
INTRAVENOUS | Status: AC
  Filled 2018-04-05: qty 20

## 2018-04-05 MED ORDER — CEFAZOLIN SODIUM-DEXTROSE 2-4 GM/100ML-% IV SOLN: 2.0000 g | INTRAVENOUS | Status: DC

## 2018-04-05 MED ORDER — ROCURONIUM BROMIDE 100 MG/10ML IV SOLN
INTRAVENOUS | Status: DC | PRN
  Administered 2018-04-05 (×3): 50 mg via INTRAVENOUS

## 2018-04-05 MED ORDER — VANCOMYCIN HCL 1000 MG IV SOLR
INTRAVENOUS | Status: DC | PRN
  Administered 2018-04-05 (×3): 1000 mg via TOPICAL

## 2018-04-05 MED ORDER — POVIDONE-IODINE 10 % EX SWAB: 2.0000 "application " | Freq: Once | CUTANEOUS | Status: DC

## 2018-04-05 MED ORDER — PHENYLEPHRINE 40 MCG/ML (10ML) SYRINGE FOR IV PUSH (FOR BLOOD PRESSURE SUPPORT)
PREFILLED_SYRINGE | INTRAVENOUS | Status: DC | PRN
  Administered 2018-04-05: 120 ug via INTRAVENOUS

## 2018-04-05 MED ORDER — SODIUM BICARBONATE 8.4 % IV SOLN
INTRAVENOUS | Status: DC | PRN
  Administered 2018-04-05: 50 mL via INTRAVENOUS

## 2018-04-05 MED ORDER — FENTANYL BOLUS VIA INFUSION
50.0000 ug | INTRAVENOUS | Status: DC
  Administered 2018-04-06 – 2018-04-07 (×5): 50 ug via INTRAVENOUS
  Filled 2018-04-05 (×23): qty 50

## 2018-04-05 MED ORDER — FENTANYL 2500MCG IN NS 250ML (10MCG/ML) PREMIX INFUSION
0.0000 ug/h | INTRAVENOUS | Status: DC
  Administered 2018-04-05 – 2018-04-06 (×2): 100 ug/h via INTRAVENOUS
  Administered 2018-04-07: 75 ug/h via INTRAVENOUS
  Administered 2018-04-09: 50 ug/h via INTRAVENOUS
  Administered 2018-04-09: 200 ug/h via INTRAVENOUS
  Administered 2018-04-10 (×2): 100 ug/h via INTRAVENOUS
  Administered 2018-04-11: 75 ug/h via INTRAVENOUS
  Administered 2018-04-11: 35 ug/h via INTRAVENOUS
  Administered 2018-04-13: 50 ug/h via INTRAVENOUS
  Administered 2018-04-14 – 2018-04-15 (×2): 150 ug/h via INTRAVENOUS
  Administered 2018-04-16: 100 ug/h via INTRAVENOUS
  Administered 2018-04-17: 50 ug/h via INTRAVENOUS
  Administered 2018-04-18 – 2018-04-20 (×2): 75 ug/h via INTRAVENOUS
  Administered 2018-04-21: 50 ug/h via INTRAVENOUS
  Administered 2018-04-22: 200 ug/h via INTRAVENOUS
  Administered 2018-04-23: 75 ug/h via INTRAVENOUS
  Administered 2018-04-24: 50 ug/h via INTRAVENOUS
  Administered 2018-04-26: 75 ug/h via INTRAVENOUS
  Filled 2018-04-05 (×19): qty 250

## 2018-04-05 MED ORDER — PROPOFOL 500 MG/50ML IV EMUL
INTRAVENOUS | Status: DC | PRN
  Administered 2018-04-05: 100 ug/kg/min via INTRAVENOUS

## 2018-04-05 MED ORDER — 0.9 % SODIUM CHLORIDE (POUR BTL) OPTIME
TOPICAL | Status: DC | PRN
  Administered 2018-04-05: 1000 mL

## 2018-04-05 MED ORDER — PHENYLEPHRINE 40 MCG/ML (10ML) SYRINGE FOR IV PUSH (FOR BLOOD PRESSURE SUPPORT)
PREFILLED_SYRINGE | INTRAVENOUS | Status: AC
  Filled 2018-04-05: qty 30

## 2018-04-05 MED ORDER — HYDRALAZINE HCL 20 MG/ML IJ SOLN
10.0000 mg | INTRAMUSCULAR | Status: DC | PRN
  Administered 2018-04-06 – 2018-04-21 (×10): 10 mg via INTRAVENOUS
  Filled 2018-04-05 (×11): qty 1

## 2018-04-05 MED ORDER — SUCCINYLCHOLINE CHLORIDE 200 MG/10ML IV SOSY
PREFILLED_SYRINGE | INTRAVENOUS | Status: AC
  Filled 2018-04-05: qty 10

## 2018-04-05 MED ORDER — SODIUM CHLORIDE 0.9% IV SOLUTION: Freq: Once | INTRAVENOUS | Status: DC

## 2018-04-05 MED ORDER — CEFAZOLIN SODIUM-DEXTROSE 2-4 GM/100ML-% IV SOLN
2.0000 g | Freq: Three times a day (TID) | INTRAVENOUS | Status: AC
  Administered 2018-04-05 – 2018-04-06 (×3): 2 g via INTRAVENOUS
  Filled 2018-04-05 (×4): qty 100

## 2018-04-05 MED ORDER — ESMOLOL HCL 100 MG/10ML IV SOLN
INTRAVENOUS | Status: DC | PRN
  Administered 2018-04-05 (×3): 30 mg via INTRAVENOUS

## 2018-04-05 MED ORDER — FENTANYL CITRATE (PF) 100 MCG/2ML IJ SOLN
50.0000 ug | INTRAMUSCULAR | Status: DC | PRN
  Administered 2018-04-05 (×2): 50 ug via INTRAVENOUS
  Filled 2018-04-05 (×2): qty 2

## 2018-04-05 MED ORDER — VANCOMYCIN HCL 1000 MG IV SOLR
INTRAVENOUS | Status: AC
  Filled 2018-04-05: qty 1000

## 2018-04-05 MED ORDER — HYDRALAZINE HCL 20 MG/ML IJ SOLN
INTRAMUSCULAR | Status: AC
  Administered 2018-04-05: 20 mg
  Filled 2018-04-05: qty 1

## 2018-04-05 MED ORDER — CHLORHEXIDINE GLUCONATE 4 % EX LIQD
60.0000 mL | Freq: Once | CUTANEOUS | Status: AC
  Administered 2018-04-05: 4 via TOPICAL
  Filled 2018-04-05: qty 60

## 2018-04-05 MED ORDER — FENTANYL CITRATE (PF) 100 MCG/2ML IJ SOLN: 50.0000 ug | INTRAMUSCULAR | Status: DC | PRN

## 2018-04-05 MED ORDER — VANCOMYCIN HCL 1000 MG IV SOLR
INTRAVENOUS | Status: AC
  Filled 2018-04-05: qty 2000

## 2018-04-05 MED ORDER — FENTANYL CITRATE (PF) 250 MCG/5ML IJ SOLN
INTRAMUSCULAR | Status: DC | PRN
  Administered 2018-04-05: 50 ug via INTRAVENOUS
  Administered 2018-04-05: 100 ug via INTRAVENOUS
  Administered 2018-04-05: 200 ug via INTRAVENOUS
  Administered 2018-04-05 (×3): 50 ug via INTRAVENOUS

## 2018-04-05 SURGICAL SUPPLY — 107 items
BANDAGE ACE 4X5 VEL STRL LF (GAUZE/BANDAGES/DRESSINGS) ×10 IMPLANT
BANDAGE ACE 6X5 VEL STRL LF (GAUZE/BANDAGES/DRESSINGS) ×10 IMPLANT
BANDAGE ESMARK 6X9 LF (GAUZE/BANDAGES/DRESSINGS) ×2 IMPLANT
BIT DRILL 2.5 X LONG (BIT) ×2
BIT DRILL 2.5X110 QC LCP DISP (BIT) ×2 IMPLANT
BIT DRILL CALI LONG 2.8MM (BIT) IMPLANT
BIT DRILL CANN 2.7X625 NONSTRL (BIT) ×2 IMPLANT
BIT DRILL X LONG 2.5 (BIT) IMPLANT
BLADE CLIPPER SURG (BLADE) IMPLANT
BLADE SURG 15 STRL LF DISP TIS (BLADE) ×2 IMPLANT
BLADE SURG 15 STRL SS (BLADE) ×2
BNDG COHESIVE 4X5 TAN STRL (GAUZE/BANDAGES/DRESSINGS) ×2 IMPLANT
BNDG ELASTIC 6X10 VLCR STRL LF (GAUZE/BANDAGES/DRESSINGS) ×2 IMPLANT
BNDG ELASTIC 6X15 VLCR STRL LF (GAUZE/BANDAGES/DRESSINGS) ×2 IMPLANT
BNDG ESMARK 6X9 LF (GAUZE/BANDAGES/DRESSINGS) ×4
BNDG GAUZE ELAST 4 BULKY (GAUZE/BANDAGES/DRESSINGS) ×2 IMPLANT
BRUSH SCRUB SURG 4.25 DISP (MISCELLANEOUS) ×8 IMPLANT
CANISTER SUCT 3000ML PPV (MISCELLANEOUS) ×2 IMPLANT
CHLORAPREP W/TINT 26ML (MISCELLANEOUS) ×10 IMPLANT
COVER SURGICAL LIGHT HANDLE (MISCELLANEOUS) ×4 IMPLANT
COVER WAND RF STERILE (DRAPES) ×4 IMPLANT
CUFF TOURNIQUET SINGLE 34IN LL (TOURNIQUET CUFF) ×4 IMPLANT
DRAPE C-ARM 42X72 X-RAY (DRAPES) ×4 IMPLANT
DRAPE C-ARMOR (DRAPES) ×4 IMPLANT
DRAPE EXTREMITY BILATERAL (DRAPES) ×2 IMPLANT
DRAPE EXTREMITY T 121X128X90 (DISPOSABLE) ×2 IMPLANT
DRAPE ORTHO SPLIT 77X108 STRL (DRAPES) ×4
DRAPE SURG ORHT 6 SPLT 77X108 (DRAPES) ×4 IMPLANT
DRAPE U-SHAPE 47X51 STRL (DRAPES) ×4 IMPLANT
DRILL BIT CALI LONG 2.8MM (BIT) ×4
DRILL BIT X LONG 2.5 (BIT) ×2
DRSG ADAPTIC 3X8 NADH LF (GAUZE/BANDAGES/DRESSINGS) ×10 IMPLANT
DRSG PAD ABDOMINAL 8X10 ST (GAUZE/BANDAGES/DRESSINGS) ×8 IMPLANT
ELECT REM PT RETURN 9FT ADLT (ELECTROSURGICAL) ×4
ELECTRODE REM PT RTRN 9FT ADLT (ELECTROSURGICAL) ×2 IMPLANT
GAUZE SPONGE 4X4 12PLY STRL (GAUZE/BANDAGES/DRESSINGS) ×8 IMPLANT
GLOVE BIO SURGEON STRL SZ 6.5 (GLOVE) ×11 IMPLANT
GLOVE BIO SURGEON STRL SZ7.5 (GLOVE) ×20 IMPLANT
GLOVE BIO SURGEONS STRL SZ 6.5 (GLOVE) ×5
GLOVE BIOGEL PI IND STRL 6.5 (GLOVE) ×2 IMPLANT
GLOVE BIOGEL PI IND STRL 7.5 (GLOVE) ×2 IMPLANT
GLOVE BIOGEL PI INDICATOR 6.5 (GLOVE) ×2
GLOVE BIOGEL PI INDICATOR 7.5 (GLOVE) ×2
GOWN STRL REUS W/ TWL LRG LVL3 (GOWN DISPOSABLE) ×4 IMPLANT
GOWN STRL REUS W/TWL LRG LVL3 (GOWN DISPOSABLE) ×4
GUIDEWARE NON THREAD 1.25X150 (WIRE) ×8
GUIDEWIRE NON THREAD 1.25X150 (WIRE) IMPLANT
IMMOBILIZER KNEE 22 UNIV (SOFTGOODS) ×2 IMPLANT
KIT BASIN OR (CUSTOM PROCEDURE TRAY) ×4 IMPLANT
KIT TURNOVER KIT B (KITS) ×4 IMPLANT
MANIFOLD NEPTUNE II (INSTRUMENTS) ×4 IMPLANT
NDL HYPO 21X1.5 SAFETY (NEEDLE) IMPLANT
NDL SUT 6 .5 CRC .975X.05 MAYO (NEEDLE) ×2 IMPLANT
NEEDLE 25GAX1.5 (MISCELLANEOUS) ×4 IMPLANT
NEEDLE HYPO 21X1.5 SAFETY (NEEDLE) IMPLANT
NEEDLE MAYO TAPER (NEEDLE) ×2
NS IRRIG 1000ML POUR BTL (IV SOLUTION) ×4 IMPLANT
PACK TOTAL JOINT (CUSTOM PROCEDURE TRAY) ×4 IMPLANT
PAD ARMBOARD 7.5X6 YLW CONV (MISCELLANEOUS) ×8 IMPLANT
PAD CAST 4YDX4 CTTN HI CHSV (CAST SUPPLIES) ×2 IMPLANT
PADDING CAST COTTON 4X4 STRL (CAST SUPPLIES) ×10
PADDING CAST COTTON 6X4 STRL (CAST SUPPLIES) ×12 IMPLANT
PADDING CAST SYN 6 (CAST SUPPLIES)
PADDING CAST SYNTHETIC 6X4 NS (CAST SUPPLIES) IMPLANT
PLATE PROX TIBIA 6H LT 3.5 VA (Plate) ×2 IMPLANT
PROS LCP PLATE 5H 72MM (Plate) ×4 IMPLANT
PROSTHESIS LCP PLATE 5H 72MM (Plate) IMPLANT
RETRIEVER SUT HEWSON (MISCELLANEOUS) ×2 IMPLANT
SCREW CANN P.T. 38MM (Screw) ×2 IMPLANT
SCREW CANN P.T. 46MM (Screw) ×2 IMPLANT
SCREW CANN P.T. 50MM (Screw) ×2 IMPLANT
SCREW CORT HEADED ST 3.5X32 (Screw) ×2 IMPLANT
SCREW HEADED ST 3.5X16 (Screw) ×6 IMPLANT
SCREW HEADED ST 3.5X18 (Screw) ×2 IMPLANT
SCREW HEADED ST 3.5X36 (Screw) ×2 IMPLANT
SCREW HEADED ST 3.5X40 (Screw) ×2 IMPLANT
SCREW HEADED ST 3.5X75 (Screw) ×4 IMPLANT
SCREW HEADED ST 3.5X80 (Screw) ×2 IMPLANT
SCREW LOCK T15 FT 16X3.5X2.9X (Screw) IMPLANT
SCREW LOCKING 3.5X16 (Screw) ×2 IMPLANT
SCREW LOCKING 3.5X70MM VA (Screw) ×2 IMPLANT
SCREW LOCKING VA 3.5X75MM (Screw) ×4 IMPLANT
SCREW PELVIC CORT ST 3.5X110 (Screw) IMPLANT
SCREW SELF TAP 3.5 110 (Screw) ×2 IMPLANT
SPONGE LAP 18X18 X RAY DECT (DISPOSABLE) IMPLANT
STAPLER VISISTAT 35W (STAPLE) ×4 IMPLANT
SUCTION FRAZIER HANDLE 10FR (MISCELLANEOUS) ×2
SUCTION TUBE FRAZIER 10FR DISP (MISCELLANEOUS) ×2 IMPLANT
SUT ETHILON 2 0 FS 18 (SUTURE) ×4 IMPLANT
SUT ETHILON 3 0 PS 1 (SUTURE) ×8 IMPLANT
SUT FIBERWIRE #2 38 T-5 BLUE (SUTURE) ×12
SUT PROLENE 0 CT (SUTURE) IMPLANT
SUT VIC AB 0 CT1 27 (SUTURE) ×2
SUT VIC AB 0 CT1 27XBRD ANBCTR (SUTURE) ×2 IMPLANT
SUT VIC AB 1 CT1 18XCR BRD 8 (SUTURE) IMPLANT
SUT VIC AB 1 CT1 27 (SUTURE) ×2
SUT VIC AB 1 CT1 27XBRD ANBCTR (SUTURE) ×2 IMPLANT
SUT VIC AB 1 CT1 8-18 (SUTURE)
SUT VIC AB 2-0 CT1 27 (SUTURE) ×6
SUT VIC AB 2-0 CT1 TAPERPNT 27 (SUTURE) ×4 IMPLANT
SUTURE FIBERWR #2 38 T-5 BLUE (SUTURE) IMPLANT
SYR CONTROL 10ML LL (SYRINGE) ×4 IMPLANT
TOWEL OR 17X24 6PK STRL BLUE (TOWEL DISPOSABLE) ×4 IMPLANT
TOWEL OR 17X26 10 PK STRL BLUE (TOWEL DISPOSABLE) ×8 IMPLANT
TRAY FOLEY MTR SLVR 16FR STAT (SET/KITS/TRAYS/PACK) IMPLANT
UNDERPAD 30X30 (UNDERPADS AND DIAPERS) ×4 IMPLANT
WATER STERILE IRR 1000ML POUR (IV SOLUTION) ×4 IMPLANT

## 2018-04-05 NOTE — Anesthesia Procedure Notes (Signed)
Arterial Line Insertion Start/End01/29/2020 2:35 PM, 04/05/2018 2:45 PM Performed by: Leonides Grills, MD, anesthesiologist  Patient location: Pre-op. Preanesthetic checklist: patient identified, IV checked, site marked, risks and benefits discussed, surgical consent, monitors and equipment checked, pre-op evaluation, timeout performed and anesthesia consent Patient sedated Right, radial was placed Catheter size: 20 Fr Hand hygiene performed , maximum sterile barriers used  and Seldinger technique used  Attempts: 1 Procedure performed using ultrasound guided technique. Ultrasound Notes:anatomy identified, needle tip was noted to be adjacent to the nerve/plexus identified and no ultrasound evidence of intravascular and/or intraneural injection Following insertion, dressing applied and Biopatch. Post procedure assessment: normal and unchanged  Patient tolerated the procedure well with no immediate complications.
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
## 2018-04-05 NOTE — Transfer of Care (Signed)
Immediate Anesthesia Transfer of Care Note  Patient: Renee [name]  Procedure(s) Performed: OPEN REDUCTION INTERNAL FIXATION (ORIF) TIBIAL PLATEAU (Left ) OPEN REDUCTION INTERNAL FIXATION (ORIF) WRIST FRACTURE (Left ) OPEN REDUCTION INTERNAL FIXATION (ORIF) ANKLE FRACTURE (Right )  Patient Location: NICU  Anesthesia Type:General  Level of Consciousness: Patient remains intubated per anesthesia plan  Airway & Oxygen Therapy: Patient remains intubated per anesthesia plan  Post-op Assessment: Report given to RN and Post -op Vital signs reviewed and stable  Post vital signs: Reviewed and stable  Last Vitals:  Vitals Value Taken Time  BP 162/95 03/30/2018  3:49 PM  Temp 35 C 04/13/2018  3:55 PM  Pulse 99 04/19/2018  3:55 PM  Resp 16 03/25/2018  3:55 PM  SpO2 99 % 04/11/2018  3:55 PM  Vitals shown include unvalidated device data.  Last Pain:  Vitals:   03/29/2018 1000  TempSrc: Core  PainSc:          Complications: No apparent anesthesia complications

## 2018-04-05 NOTE — Progress Notes (Signed)
Patient ID: Renee [name], female   DOB: [birth year], 73 y.o.   MRN: [id number] Follow up - Trauma Critical Care  Patient Details:    Renee [name] is an 73 y.o. female.  Lines/tubes : Airway 7.5 mm (Active)  Secured at (cm) 25 cm 2018/04/21  7:15 AM  Measured From Lips 04-21-2018  7:15 AM  Secured Location Left 04/21/18  7:15 AM  Secured By [employer] 04-21-2018  7:15 AM  Tube Holder Repositioned Yes 04/21/2018  7:15 AM  Cuff Pressure (cm H2O) 28 cm H2O 04/04/2018  8:32 PM  Site Condition Dry Apr 21, 2018  7:15 AM     PICC Double Lumen 04/04/18 PICC Right Basilic 48 cm 3 cm (Active)  Indication for Insertion or Continuance of Line Poor Vasculature-patient has had multiple peripheral attempts or PIVs lasting less than 24 hours 04/04/2018  8:00 PM  Exposed Catheter (cm) 3 cm 04/04/2018  2:00 PM  Site Assessment Clean;Dry;Intact 04/04/2018  8:00 PM  Lumen #1 Status Flushed;Infusing 04/04/2018  8:00 PM  Lumen #2 Status Flushed;Infusing 04/04/2018  8:00 PM  Dressing Type Transparent 04/04/2018  8:00 PM  Dressing Status Clean;Dry;Intact;Antimicrobial disc in place 04/04/2018  8:00 PM  Line Care Lumen 1 tubing changed;Lumen 2 tubing changed 04/04/2018  2:52 PM  Dressing Change Due 04/11/18 04/04/2018  8:00 PM     NG/OG Tube Orogastric 18 Fr. Right mouth Xray (Active)  Site Assessment Clean;Dry;Intact April 21, 2018  4:00 AM  Ongoing Placement Verification No change in cm markings or external length of tube from initial placement;No change in respiratory status;No acute changes, not attributed to clinical condition 04/04/2018  3:00 AM  Status Clamped April 21, 2018  4:00 AM     Urethral Catheter Rodman Pickle, EMT Latex;Temperature probe;Straight-tip 14 Fr. (Active)  Indication for Insertion or Continuance of Catheter Unstable critical patients (first 24-48 hours) 04-21-18  4:00 AM  Site Assessment Clean;Intact 04/21/2018  4:00 AM  Catheter Maintenance Bag below level of bladder;Catheter secured;Drainage  bag/tubing not touching floor;Insertion date on drainage bag;No dependent loops;Seal intact 04/21/18  4:00 AM  Collection Container Standard drainage bag 04/04/2018  3:00 AM  Securement Method Securing device (Describe) 04/04/2018  3:00 AM  Urinary Catheter Interventions Unclamped Apr 21, 2018  4:00 AM  Output (mL) 450 mL Apr 21, 2018  7:00 AM    Microbiology/Sepsis markers: Results for orders placed or performed during the hospital encounter of 04/18/2018  Urine culture     Status: None   Collection Time: 04/04/18  1:42 AM  Result Value Ref Range Status   Specimen Description URINE, CATHETERIZED  Final   Special Requests NONE  Final   Culture   Final    NO GROWTH Performed at [facility], [address] [place], [place] [address]    Report Status 04/21/18 FINAL  Final  MRSA PCR Screening     Status: Abnormal   Collection Time: 04/04/18  3:18 AM  Result Value Ref Range Status   MRSA by PCR POSITIVE (A) NEGATIVE Final    Comment:        The GeneXpert MRSA Assay (FDA approved for NASAL specimens only), is one component of a comprehensive MRSA colonization surveillance program. It is not intended to diagnose MRSA infection nor to guide or monitor treatment for MRSA infections. RESULT CALLED TO, READ BACK BY AND VERIFIED WITH: [name] RN [id number] 04/04/18 [name] Performed at [facility], [address] [place], [place] [address]     Anti-infectives:  Anti-infectives (From admission, onward)  Start     Dose/Rate Route Frequency Ordered Stop   03/20/2018 1000  vancomycin (VANCOCIN) IVPB 1000 mg/200 mL premix     1,000 mg 200 mL/hr over 60 Minutes Intravenous To [facility] 04/04/18 1257 04/06/18 1000   03/22/2018 0930  ceFAZolin (ANCEF) IVPB 2g/100 mL premix     2 g 200 mL/hr over 30 Minutes Intravenous To [facility] 03/25/2018 0712 04/06/18 0930   04/04/18 0930  ceFAZolin (ANCEF) IVPB 1 g/50 mL premix     1 g 100 mL/hr over 30 Minutes Intravenous Every  8 hours 04/04/18 0840        Best Practice/Protocols:  VTE Prophylaxis: Mechanical Continous Sedation  Consults: Treatment Team:  Tia Alert, MD Haddix, Gillie Manners, MD    Studies:    Events:  Subjective:    Overnight Issues:   Objective:  Vital signs for last 24 hours: Temp:  [99.1 F (37.3 C)-102 F (38.9 C)] 99.1 F (37.3 C) (01/17 0715) Pulse Rate:  [91-129] 113 (01/17 0715) Resp:  [8-25] 21 (01/17 0715) BP: (117-207)/(58-112) 163/75 (01/17 0715) SpO2:  [95 %-100 %] 100 % (01/17 0715) FiO2 (%):  [30 %-40 %] 30 % (01/17 0715)  Hemodynamic parameters for last 24 hours:    Intake/Output from previous day: 01/16 0701 - 01/17 0700 In: 2631 [I.V.:2431; IV Piggyback:200] Out: 1670 [Urine:1670]  Intake/Output this shift: No intake/output data recorded.  Vent settings for last 24 hours: Vent Mode: PRVC FiO2 (%):  [30 %-40 %] 30 % Set Rate:  [16 bmp] 16 bmp Vt Set:  [550 mL] 550 mL PEEP:  [5 cmH20] 5 cmH20 Plateau Pressure:  [15 cmH20-19 cmH20] 19 cmH20  Physical Exam:  General: on vent Neuro: sedated but does F/C HEENT/Neck: ETT Resp: clear to auscultation bilaterally CVS: RRR GI: soft, nontender, BS WNL, no r/g Extremities: BLE KI, splint R LE  Results for orders placed or performed during the hospital encounter of April 20, 2018 (from the past 24 hour(s))  Provider-confirm verbal Blood Bank order - RBC, FFP, Type & Screen; 4 Units; Order taken: 04/20/18; 10:21 PM; Level 1 Trauma, Emergency Release, STAT 2 units of O negative red cells and 2 units of A plasmas emergency released to the ER @ 2225. Al...     Status: None   Collection Time: 04/04/18 10:02 AM  Result Value Ref Range   Blood product order confirm MD AUTHORIZATION REQUESTED   Glucose, capillary     Status: Abnormal   Collection Time: 04/04/18 11:18 AM  Result Value Ref Range   Glucose-Capillary 153 (H) 70 - 99 mg/dL   Comment 1 Notify RN    Comment 2 Document in Chart   BLOOD TRANSFUSION  REPORT - SCANNED     Status: None   Collection Time: 04/04/18 12:29 PM   Narrative   Ordered by an unspecified provider.  CBC     Status: Abnormal   Collection Time: 04/04/18  2:54 PM  Result Value Ref Range   WBC 13.8 (H) 4.0 - 10.5 K/uL   RBC 3.37 (L) 3.87 - 5.11 MIL/uL   Hemoglobin 9.6 (L) 12.0 - 15.0 g/dL   HCT 03.1 (L) 59.4 - 58.5 %   MCV 87.2 80.0 - 100.0 fL   MCH 28.5 26.0 - 34.0 pg   MCHC 32.7 30.0 - 36.0 g/dL   RDW 92.9 (H) 24.4 - 62.8 %   Platelets 123 (L) 150 - 400 K/uL   nRBC 0.0 0.0 - 0.2 %  Glucose, capillary  Status: Abnormal   Collection Time: 04/04/18  3:31 PM  Result Value Ref Range   Glucose-Capillary 143 (H) 70 - 99 mg/dL   Comment 1 Notify RN    Comment 2 Document in Chart   Glucose, capillary     Status: Abnormal   Collection Time: 04/04/18  7:32 PM  Result Value Ref Range   Glucose-Capillary 112 (H) 70 - 99 mg/dL  Glucose, capillary     Status: Abnormal   Collection Time: 04/04/18 11:33 PM  Result Value Ref Range   Glucose-Capillary 135 (H) 70 - 99 mg/dL  Glucose, capillary     Status: Abnormal   Collection Time: 04/02/2018  3:38 AM  Result Value Ref Range   Glucose-Capillary 114 (H) 70 - 99 mg/dL  Glucose, capillary     Status: Abnormal   Collection Time: 03/31/2018  7:59 AM  Result Value Ref Range   Glucose-Capillary 143 (H) 70 - 99 mg/dL    Assessment & Plan: Present on Admission: **None**    LOS: 1 day   Additional comments:I reviewed the patient's new clinical lab test results. [facility] per Dr. Yetta BarreJones, F/U CT head today with some increase in L FICC and R SAH C1-2 FXs - collar per Dr. Yetta BarreJones B rib FX (R 2-9; L2, 4-7) with small R PTX - no PTX on CXR today Acute hypoxic ventilator dependent respiratory failure - full support P neuro stability. Start wean tomorrow as going to OR this AM ABL anemia - CBC now L ulna and 4th finger FX - per Haddix R scapula FX - per Dr. Jena GaussHaddix B tibial plateau FX - to OR tomorrow with Dr. Jena GaussHaddix R  fibula FX - per Dr. Jena GaussHaddix R bimal ankle FX - per Dr. Jena GaussHaddix Hyperglycemia - SSI, change IVF HX schizophrenia per family VTE - plexipulse, no Lovenox yet with TBI FEN - labs not done yet - being sent now. Plan start TF tomorrow Dispo - ICU, OR Critical Care Total Time*: 334 Minutes  Violeta GelinasBurke Alyssha Housh, MD, MPH, FACS Trauma: [phone] General Surgery: [phone]  03/22/2018  *Care during the described time interval was provided by me. I have reviewed this patient's available data, including medical history, events of note, physical examination and test results as part of my evaluation.

## 2018-04-05 NOTE — Progress Notes (Signed)
RT Note:  Patient suctioned before and after and transported to CT without any complications.
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
## 2018-04-05 NOTE — Progress Notes (Signed)
Orthopedic Tech Progress Note Patient Details:  Renee [name] 09-Jan-1946 [id number]  Patient ID: Renee [name], female   DOB: 1946/02/11, 73 y.o.   MRN: [id number] Called in order to bio tech  Renee [name] 04/03/2018, 4:37 PM
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
## 2018-04-06 ENCOUNTER — Inpatient Hospital Stay (HOSPITAL_COMMUNITY): Payer: Medicare Other

## 2018-04-06 LAB — BPAM RBC
BLOOD PRODUCT EXPIRATION DATE: 202002182359
Blood Product Expiration Date: 202002012359
Blood Product Expiration Date: 202002012359
Blood Product Expiration Date: 202002172359
Blood Product Expiration Date: 202002202359
Blood Product Expiration Date: 202002202359
ISSUE DATE / TIME: 202001152222
ISSUE DATE / TIME: 202001152222
ISSUE DATE / TIME: 202001161045
ISSUE DATE / TIME: 202001171238
ISSUE DATE / TIME: 202001171238
ISSUE DATE / TIME: 202001152303
Unit Type and Rh: 5100
Unit Type and Rh: 5100
Unit Type and Rh: 5100
Unit Type and Rh: 5100
Unit Type and Rh: 9500
Unit Type and Rh: 9500

## 2018-04-06 LAB — GLUCOSE, CAPILLARY
GLUCOSE-CAPILLARY: 94 mg/dL (ref 70–99)
Glucose-Capillary: 85 mg/dL (ref 70–99)
Glucose-Capillary: 81 mg/dL (ref 70–99)
Glucose-Capillary: 140 mg/dL — ABNORMAL HIGH (ref 70–99)
Glucose-Capillary: 127 mg/dL — ABNORMAL HIGH (ref 70–99)
Glucose-Capillary: 182 mg/dL — ABNORMAL HIGH (ref 70–99)

## 2018-04-06 LAB — TYPE AND SCREEN
ABO/RH(D): O POS
Antibody Screen: NEGATIVE
Unit division: 0
Unit division: 0
Unit division: 0
Unit division: 0
Unit division: 0
Unit division: 0

## 2018-04-06 LAB — BASIC METABOLIC PANEL
Anion gap: 9 (ref 5–15)
BUN: 12 mg/dL (ref 8–23)
CO2: 19 mmol/L — ABNORMAL LOW (ref 22–32)
Calcium: 6.9 mg/dL — ABNORMAL LOW (ref 8.9–10.3)
Chloride: 120 mmol/L — ABNORMAL HIGH (ref 98–111)
Creatinine, Ser: 1.01 mg/dL — ABNORMAL HIGH (ref 0.44–1.00)
GFR calc Af Amer: >60 mL/min (ref >60)
GFR calc non Af Amer: 55 mL/min — ABNORMAL LOW (ref >60)
Glucose, Bld: 105 mg/dL — ABNORMAL HIGH (ref 70–99)
Potassium: 3.4 mmol/L — ABNORMAL LOW (ref 3.5–5.1)
SODIUM: 148 mmol/L — AB (ref 135–145)

## 2018-04-06 MED ORDER — PIVOT 1.5 CAL PO LIQD
1000.0000 mL | ORAL | Status: DC
  Administered 2018-04-06 – 2018-04-26 (×19): 1000 mL

## 2018-04-06 MED ORDER — PRO-STAT SUGAR FREE PO LIQD
30.0000 mL | Freq: Two times a day (BID) | ORAL | Status: DC
  Administered 2018-04-06 – 2018-04-26 (×40): 30 mL via ORAL
  Filled 2018-04-06 (×40): qty 30
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
## 2018-04-07 ENCOUNTER — Inpatient Hospital Stay (HOSPITAL_COMMUNITY): Payer: Medicare Other

## 2018-04-07 LAB — BASIC METABOLIC PANEL
Anion gap: 8 (ref 5–15)
BUN: 17 mg/dL (ref 8–23)
CO2: 20 mmol/L — ABNORMAL LOW (ref 22–32)
Calcium: 7.1 mg/dL — ABNORMAL LOW (ref 8.9–10.3)
Chloride: 122 mmol/L — ABNORMAL HIGH (ref 98–111)
Creatinine, Ser: 0.86 mg/dL (ref 0.44–1.00)
GFR calc non Af Amer: >60 mL/min (ref >60)
Glucose, Bld: 175 mg/dL — ABNORMAL HIGH (ref 70–99)
POTASSIUM: 3.3 mmol/L — AB (ref 3.5–5.1)
Sodium: 150 mmol/L — ABNORMAL HIGH (ref 135–145)

## 2018-04-07 LAB — CBC WITH DIFFERENTIAL/PLATELET
Abs Immature Granulocytes: 0.09 10*3/uL — ABNORMAL HIGH (ref 0.00–0.07)
Basophils Absolute: 0 10*3/uL (ref 0.0–0.1)
Basophils Relative: 0 %
Eosinophils Absolute: 0 10*3/uL (ref 0.0–0.5)
Eosinophils Relative: 0 %
HCT: 29.7 % — ABNORMAL LOW (ref 36.0–46.0)
Hemoglobin: 9.3 g/dL — ABNORMAL LOW (ref 12.0–15.0)
Immature Granulocytes: 1 %
Lymphocytes Relative: 9 %
Lymphs Abs: 0.9 10*3/uL (ref 0.7–4.0)
MCH: 28.5 pg (ref 26.0–34.0)
MCHC: 31.3 g/dL (ref 30.0–36.0)
MCV: 91.1 fL (ref 80.0–100.0)
Monocytes Absolute: 0.5 10*3/uL (ref 0.1–1.0)
Monocytes Relative: 5 %
Neutro Abs: 8.3 10*3/uL — ABNORMAL HIGH (ref 1.7–7.7)
Neutrophils Relative %: 85 %
Platelets: 115 10*3/uL — ABNORMAL LOW (ref 150–400)
RBC: 3.26 MIL/uL — ABNORMAL LOW (ref 3.87–5.11)
RDW: 16.3 % — ABNORMAL HIGH (ref 11.5–15.5)
WBC: 9.8 10*3/uL (ref 4.0–10.5)
nRBC: 0.2 % (ref 0.0–0.2)

## 2018-04-07 LAB — CBC
HCT: 28.5 % — ABNORMAL LOW (ref 36.0–46.0)
Hemoglobin: 9.1 g/dL — ABNORMAL LOW (ref 12.0–15.0)
MCH: 28.7 pg (ref 26.0–34.0)
MCHC: 31.9 g/dL (ref 30.0–36.0)
MCV: 89.9 fL (ref 80.0–100.0)
Platelets: 80 10*3/uL — ABNORMAL LOW (ref 150–400)
RBC: 3.17 MIL/uL — ABNORMAL LOW (ref 3.87–5.11)
RDW: 16 % — ABNORMAL HIGH (ref 11.5–15.5)
WBC: 7.9 10*3/uL (ref 4.0–10.5)
nRBC: 0 % (ref 0.0–0.2)

## 2018-04-07 LAB — GLUCOSE, CAPILLARY
Glucose-Capillary: 102 mg/dL — ABNORMAL HIGH (ref 70–99)
Glucose-Capillary: 129 mg/dL — ABNORMAL HIGH (ref 70–99)
Glucose-Capillary: 117 mg/dL — ABNORMAL HIGH (ref 70–99)
Glucose-Capillary: 157 mg/dL — ABNORMAL HIGH (ref 70–99)
Glucose-Capillary: 147 mg/dL — ABNORMAL HIGH (ref 70–99)
Glucose-Capillary: 127 mg/dL — ABNORMAL HIGH (ref 70–99)

## 2018-04-07 MED ORDER — OXYCODONE HCL 5 MG/5ML PO SOLN
5.0000 mg | ORAL | Status: DC | PRN
  Administered 2018-04-07 – 2018-04-12 (×3): 5 mg via ORAL
  Filled 2018-04-07 (×3): qty 5
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
## 2018-04-07 NOTE — Progress Notes (Signed)
Dr Sheliah Hatch paged regarding urinary retention (patient's foley removed 1/18 at 0800) and inability to void on own.  Pt has been I&O'd x 3 with need to do again.  Dr Sheliah Hatch gave order to insert foley for urinary retention. Patient's EKG strips were questionable tremor vs A flutter.  Told Dr Sheliah Hatch of this finding and no baseline strip to compare.  I asked that he evaluate saved strip from 0800 today to determine which it was. Also asked if we needed to resume her psych medications.  He did not feel it was necessary at this time. Will continue to monitor patient. Kassi Esteve C 1:41 PM

## 2018-04-08 ENCOUNTER — Inpatient Hospital Stay (HOSPITAL_COMMUNITY): Payer: Medicare Other | Admitting: Certified Registered Nurse Anesthetist

## 2018-04-08 ENCOUNTER — Encounter (HOSPITAL_COMMUNITY): Payer: Self-pay | Admitting: Student

## 2018-04-08 ENCOUNTER — Inpatient Hospital Stay (HOSPITAL_COMMUNITY): Payer: Medicare Other

## 2018-04-08 LAB — CBC
HCT: 26.5 % — ABNORMAL LOW (ref 36.0–46.0)
Hemoglobin: 8.3 g/dL — ABNORMAL LOW (ref 12.0–15.0)
MCH: 29.4 pg (ref 26.0–34.0)
MCHC: 31.3 g/dL (ref 30.0–36.0)
MCV: 94 fL (ref 80.0–100.0)
Platelets: 120 10*3/uL — ABNORMAL LOW (ref 150–400)
RBC: 2.82 MIL/uL — AB (ref 3.87–5.11)
RDW: 16.5 % — ABNORMAL HIGH (ref 11.5–15.5)
WBC: 8.6 10*3/uL (ref 4.0–10.5)
nRBC: 0.2 % (ref 0.0–0.2)

## 2018-04-08 LAB — GLUCOSE, CAPILLARY
Glucose-Capillary: 141 mg/dL — ABNORMAL HIGH (ref 70–99)
Glucose-Capillary: 129 mg/dL — ABNORMAL HIGH (ref 70–99)
Glucose-Capillary: 118 mg/dL — ABNORMAL HIGH (ref 70–99)
Glucose-Capillary: 152 mg/dL — ABNORMAL HIGH (ref 70–99)
Glucose-Capillary: 133 mg/dL — ABNORMAL HIGH (ref 70–99)
Glucose-Capillary: 145 mg/dL — ABNORMAL HIGH (ref 70–99)

## 2018-04-08 LAB — BASIC METABOLIC PANEL
Anion gap: 6 (ref 5–15)
BUN: 19 mg/dL (ref 8–23)
CO2: 19 mmol/L — ABNORMAL LOW (ref 22–32)
Calcium: 6.5 mg/dL — ABNORMAL LOW (ref 8.9–10.3)
Chloride: 128 mmol/L — ABNORMAL HIGH (ref 98–111)
Creatinine, Ser: 0.71 mg/dL (ref 0.44–1.00)
GFR calc Af Amer: >60 mL/min (ref >60)
GFR calc non Af Amer: >60 mL/min (ref >60)
Glucose, Bld: 136 mg/dL — ABNORMAL HIGH (ref 70–99)
POTASSIUM: 5.1 mmol/L (ref 3.5–5.1)
Sodium: 153 mmol/L — ABNORMAL HIGH (ref 135–145)

## 2018-04-08 MED ORDER — CLONAZEPAM 0.125 MG PO TBDP
0.2500 mg | ORAL_TABLET | Freq: Two times a day (BID) | ORAL | Status: DC
  Administered 2018-04-08 (×2): 0.25 mg via ORAL
  Filled 2018-04-08 (×2): qty 2

## 2018-04-08 MED ORDER — RACEPINEPHRINE HCL 2.25 % IN NEBU
0.5000 mL | INHALATION_SOLUTION | Freq: Once | RESPIRATORY_TRACT | Status: AC
  Administered 2018-04-08: 0.5 mL via RESPIRATORY_TRACT

## 2018-04-08 MED ORDER — SODIUM CHLORIDE 0.45 % IV SOLN
INTRAVENOUS | Status: DC
  Administered 2018-04-08: 09:00:00 via INTRAVENOUS
  Filled 2018-04-08 (×2): qty 1000

## 2018-04-08 MED ORDER — FREE WATER
200.0000 mL | Freq: Four times a day (QID) | Status: DC
  Administered 2018-04-08 – 2018-04-11 (×11): 200 mL

## 2018-04-08 MED ORDER — SODIUM CHLORIDE 0.45 % IV SOLN
INTRAVENOUS | Status: DC
  Administered 2018-04-08 – 2018-04-16 (×6): via INTRAVENOUS

## 2018-04-08 MED ORDER — METOPROLOL TARTRATE 25 MG/10 ML ORAL SUSPENSION
25.0000 mg | Freq: Two times a day (BID) | ORAL | Status: DC
  Administered 2018-04-08 (×2): 25 mg
  Filled 2018-04-08 (×2): qty 10

## 2018-04-08 MED ORDER — CLONAZEPAM 0.1 MG/ML ORAL SUSPENSION: 0.2500 mg | Freq: Two times a day (BID) | ORAL | Status: DC

## 2018-04-08 MED ORDER — RACEPINEPHRINE HCL 2.25 % IN NEBU
INHALATION_SOLUTION | RESPIRATORY_TRACT | Status: AC
  Filled 2018-04-08: qty 0.5

## 2018-04-08 MED ORDER — CALCIUM GLUCONATE-NACL 1-0.675 GM/50ML-% IV SOLN
1.0000 g | Freq: Once | INTRAVENOUS | Status: AC
  Administered 2018-04-08: 1000 mg via INTRAVENOUS
  Filled 2018-04-08: qty 50

## 2018-04-08 NOTE — Anesthesia Postprocedure Evaluation (Signed)
Anesthesia Post Note  Patient: Renee [name]  Procedure(s) Performed: OPEN REDUCTION INTERNAL FIXATION (ORIF) TIBIAL PLATEAU (Left ) OPEN REDUCTION INTERNAL FIXATION (ORIF) WRIST FRACTURE (Left ) OPEN REDUCTION INTERNAL FIXATION (ORIF) ANKLE FRACTURE (Right )     Patient location during evaluation: SICU Anesthesia Type: General Level of consciousness: sedated Pain management: pain level controlled Vital Signs Assessment: post-procedure vital signs reviewed and stable Respiratory status: patient remains intubated per anesthesia plan Cardiovascular status: stable Postop Assessment: no apparent nausea or vomiting Anesthetic complications: no    Last Vitals:  Vitals:   04/08/18 0748 04/08/18 0800  BP: (!) 148/53   Pulse: (!) 107   Resp: (!) 21   Temp:  37.6 C  SpO2: 100%     Last Pain:  Vitals:   04/08/18 0800  TempSrc: Axillary  PainSc:                  Catheryn Bacon Ellender

## 2018-04-08 NOTE — Progress Notes (Signed)
Patient ID: Renee [name], female   DOB: 1946-03-08, 73 y.o.   MRN: [id number] Tachypneic after extubation Racemic epi given Teeth clenched and willnot cough Reintubated by anesthesia. Will discuss possible trach with family  Violeta Gelinas, MD, MPH, FACS Trauma: [phone] General Surgery: [phone]

## 2018-04-08 NOTE — Progress Notes (Signed)
Pt had a 30 beat run of V-tach. Notified Trauma MD, EKG completed. No new orders. Will continue to monitor.
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
## 2018-04-08 NOTE — Progress Notes (Signed)
RT NOTE: RT extubated patient to 4L [place] per MD with RN at bedside. Positive cuff leak was noted. Patient was able to cough before extubation.   Soon after extubation, patient developed stridor and RT gave patient racemic epi per MD. Patient's cough was weak after extubation. Patient started to use more accessory muscles and WOB increased. MD aware and called anesthesia for reintubation.   RT will continue to monitor.

## 2018-04-08 NOTE — Progress Notes (Signed)
RT NOTE: RT given Racepinephrine due to stridor post extubation per MD. RN at bedside.

## 2018-04-08 NOTE — Progress Notes (Signed)
Subjective: Patient remains intubated, opens eyes to voice  Objective: Vital signs in last 24 hours: Temp:  [99.2 F (37.3 C)-100 F (37.8 C)] 99.7 F (37.6 C) (01/20 0800) Pulse Rate:  [74-110] 107 (01/20 0748) Resp:  [13-32] 21 (01/20 0748) BP: (148-184)/(53-72) 148/53 (01/20 0748) SpO2:  [100 %] 100 % (01/20 0748) Arterial Line BP: (142-196)/(49-68) 142/60 (01/20 0600) FiO2 (%):  [30 %] 30 % (01/20 0748)  Intake/Output from previous day: 01/19 0701 - 01/20 0700 In: 3511.6 [I.V.:2431.6; NG/GT:1080] Out: 905 [Urine:905] Intake/Output this shift: No intake/output data recorded.   Lab Results: Lab Results  Component Value Date   WBC 9.8 04/07/2018   HGB 9.3 (L) 04/07/2018   HCT 29.7 (L) 04/07/2018   MCV 91.1 04/07/2018   PLT 115 (L) 04/07/2018   Lab Results  Component Value Date   INR 1.27 04/06/2018   BMET Lab Results  Component Value Date   NA 150 (H) 04/07/2018   K 3.3 (L) 04/07/2018   CL 122 (H) 04/07/2018   CO2 20 (L) 04/07/2018   GLUCOSE 175 (H) 04/07/2018   BUN 17 04/07/2018   CREATININE 0.86 04/07/2018   CALCIUM 7.1 (L) 04/07/2018    Studies/Results: Dg Chest Port 1 View  Result Date: 04/07/2018 CLINICAL DATA:  Hypoxia EXAM: PORTABLE CHEST 1 VIEW COMPARISON:  April 06, 2018. FINDINGS: Endotracheal tube tip is 2.9 cm above the carina. Nasogastric tube tip and side port are in the stomach region. Central catheter tip is in the right atrium slightly distal to the cavoatrial junction. No pneumothorax. There is a small pleural effusion on the right. There is atelectatic change in both lung bases with consolidation in the medial left base. There is also a focus of consolidation in the right upper lobe, stable. No new opacities are evident. Heart is upper normal in size with pulmonary vascularity normal. No adenopathy. There is evidence of prior rib trauma bilaterally with displaced fractures, more severe on the right. Postoperative changes noted in the thyroid  region. IMPRESSION: Tube and catheter positions as described without pneumothorax. Areas of airspace consolidation in the right upper lobe and left base regions. Small right pleural effusion. No new opacity. Displaced rib fractures again noted. Electronically Signed   By: Bretta BangWilliam  Woodruff III M.D.   On: 04/07/2018 07:38    Assessment/Plan: TBI: No change in neuro status. No new nsgy recom.    LOS: 4 days    Renee LoftKimberly Hannah Huan Pollok 04/08/2018, 8:08 AM
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
## 2018-04-08 NOTE — Anesthesia Procedure Notes (Signed)
Procedure Name: Intubation Date/Time: 04/08/2018 10:22 AM Performed by: Mayer Camel, CRNA Pre-anesthesia Checklist: Patient identified, Emergency Drugs available, Suction available, Patient being monitored and Timeout performed Patient Re-evaluated:Patient Re-evaluated prior to induction Oxygen Delivery Method: Circle System Utilized Preoxygenation: Pre-oxygenation with 100% oxygen Induction Type: IV induction Ventilation: Mask ventilation without difficulty Laryngoscope Size: Glidescope and 3 Grade View: Grade I Tube type: Subglottic suction tube Tube size: 7.5 mm Number of attempts: 1 Airway Equipment and Method: Video-laryngoscopy and Rigid stylet Placement Confirmation: ETT inserted through vocal cords under direct vision,  positive ETCO2 and breath sounds checked- equal and bilateral Secured at: 24 cm Tube secured with: Tape Dental Injury: Teeth and Oropharynx as per pre-operative assessment

## 2018-04-09 ENCOUNTER — Inpatient Hospital Stay (HOSPITAL_COMMUNITY): Payer: Medicare Other

## 2018-04-09 LAB — GLUCOSE, CAPILLARY
Glucose-Capillary: 141 mg/dL — ABNORMAL HIGH (ref 70–99)
Glucose-Capillary: 170 mg/dL — ABNORMAL HIGH (ref 70–99)
Glucose-Capillary: 158 mg/dL — ABNORMAL HIGH (ref 70–99)
Glucose-Capillary: 105 mg/dL — ABNORMAL HIGH (ref 70–99)
Glucose-Capillary: 121 mg/dL — ABNORMAL HIGH (ref 70–99)
Glucose-Capillary: 145 mg/dL — ABNORMAL HIGH (ref 70–99)

## 2018-04-09 LAB — BASIC METABOLIC PANEL
Anion gap: 6 (ref 5–15)
BUN: 25 mg/dL — ABNORMAL HIGH (ref 8–23)
CHLORIDE: 119 mmol/L — AB (ref 98–111)
CO2: 22 mmol/L (ref 22–32)
Calcium: 7.6 mg/dL — ABNORMAL LOW (ref 8.9–10.3)
Creatinine, Ser: 0.86 mg/dL (ref 0.44–1.00)
GFR calc Af Amer: >60 mL/min (ref >60)
GFR calc non Af Amer: >60 mL/min (ref >60)
Glucose, Bld: 160 mg/dL — ABNORMAL HIGH (ref 70–99)
POTASSIUM: 3.1 mmol/L — AB (ref 3.5–5.1)
Sodium: 147 mmol/L — ABNORMAL HIGH (ref 135–145)

## 2018-04-09 LAB — CBC
HEMATOCRIT: 27.1 % — AB (ref 36.0–46.0)
Hemoglobin: 8.3 g/dL — ABNORMAL LOW (ref 12.0–15.0)
MCH: 28.6 pg (ref 26.0–34.0)
MCHC: 30.6 g/dL (ref 30.0–36.0)
MCV: 93.4 fL (ref 80.0–100.0)
Platelets: 149 10*3/uL — ABNORMAL LOW (ref 150–400)
RBC: 2.9 MIL/uL — ABNORMAL LOW (ref 3.87–5.11)
RDW: 16.8 % — ABNORMAL HIGH (ref 11.5–15.5)
WBC: 8.3 10*3/uL (ref 4.0–10.5)
nRBC: 0.4 % — ABNORMAL HIGH (ref 0.0–0.2)

## 2018-04-09 MED ORDER — METOPROLOL TARTRATE 25 MG/10 ML ORAL SUSPENSION
50.0000 mg | Freq: Two times a day (BID) | ORAL | Status: DC
  Administered 2018-04-09 – 2018-04-26 (×35): 50 mg
  Filled 2018-04-09 (×35): qty 20

## 2018-04-09 MED ORDER — BETHANECHOL CHLORIDE 10 MG PO TABS
25.0000 mg | ORAL_TABLET | Freq: Three times a day (TID) | ORAL | Status: DC
  Administered 2018-04-09 – 2018-04-26 (×53): 25 mg
  Filled 2018-04-09 (×52): qty 3

## 2018-04-09 MED ORDER — POTASSIUM CHLORIDE 20 MEQ/15ML (10%) PO SOLN
40.0000 meq | Freq: Two times a day (BID) | ORAL | Status: AC
  Administered 2018-04-09 (×2): 40 meq
  Filled 2018-04-09 (×2): qty 30

## 2018-04-09 MED ORDER — QUETIAPINE FUMARATE 25 MG PO TABS
25.0000 mg | ORAL_TABLET | Freq: Two times a day (BID) | ORAL | Status: DC
  Administered 2018-04-09 – 2018-04-26 (×35): 25 mg
  Filled 2018-04-09 (×35): qty 1

## 2018-04-09 MED ORDER — CLONAZEPAM 0.1 MG/ML ORAL SUSPENSION: 0.2500 mg | Freq: Two times a day (BID) | ORAL | Status: DC

## 2018-04-09 MED ORDER — CLONAZEPAM 0.5 MG PO TBDP
0.5000 mg | ORAL_TABLET | Freq: Two times a day (BID) | ORAL | Status: DC
  Administered 2018-04-09 – 2018-04-18 (×20): 0.5 mg via ORAL
  Filled 2018-04-09 (×20): qty 1
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
## 2018-04-09 NOTE — Progress Notes (Signed)
Follow up - Trauma Critical Care  Patient Details:    Renee [name] is an 73 y.o. female.  Lines/tubes : Airway 7.5 mm (Active)  Secured at (cm) 26 cm 04/09/2018  4:03 AM  Measured From Lips 04/09/2018  4:03 AM  Secured Location Left 04/09/2018  4:03 AM  Secured By [employer] Tube Holder 04/09/2018  4:03 AM  Tube Holder Repositioned Yes 04/09/2018  4:03 AM  Cuff Pressure (cm H2O) 25 cm H2O 04/08/2018  7:25 PM  Site Condition Dry 04/09/2018  4:03 AM     PICC Double Lumen 04/04/18 PICC Right Basilic 48 cm 3 cm (Active)  Indication for Insertion or Continuance of Line Limited venous access - need for IV therapy >5 days (PICC only) 04/08/2018  8:00 PM  Exposed Catheter (cm) 3 cm 04/04/2018  2:00 PM  Site Assessment Clean;Dry;Intact 04/08/2018  8:00 PM  Lumen #1 Status Infusing 04/08/2018  8:00 PM  Lumen #2 Status Infusing;In-line blood sampling system in place 04/08/2018  8:00 PM  Dressing Type Transparent;Occlusive 04/08/2018  8:00 PM  Dressing Status Clean;Dry;Intact 04/08/2018  8:00 PM  Line Care Connections checked and tightened 04/08/2018  8:00 PM  Dressing Change Due 04/11/18 04/08/2018  8:00 PM     Arterial Line 04/19/2018 Radial (Active)  Site Assessment Clean;Dry;Intact 04/08/2018  8:00 PM  Line Status Pulsatile blood flow 04/08/2018  8:00 PM  Art Line Waveform Appropriate 04/08/2018  8:00 PM  Art Line Interventions Zeroed and calibrated;Leveled 04/08/2018  8:00 PM  Color/Movement/Sensation Capillary refill less than 3 sec 04/08/2018  8:00 PM  Dressing Type Transparent;Occlusive 04/08/2018  8:00 PM  Dressing Status Clean;Dry;Intact;Antimicrobial disc in place 04/08/2018  8:00 PM  Dressing Change Due 04/12/18 04/08/2018  8:00 PM     NG/OG Tube Orogastric Center mouth Xray (Active)  Site Assessment Clean;Dry;Intact 04/08/2018  8:00 PM  Ongoing Placement Verification No change in cm markings or external length of tube from initial placement;No change in respiratory status;No acute changes, not  attributed to clinical condition;Xray 04/08/2018  8:00 PM  Status Infusing tube feed 04/08/2018  8:00 PM     Urethral Catheter Junious Dresseronnie dupont Latex 14 Fr. (Active)  Indication for Insertion or Continuance of Catheter Acute urinary retention 04/08/2018  8:00 PM  Site Assessment Clean;Intact 04/08/2018  8:00 PM  Catheter Maintenance Bag below level of bladder;Catheter secured;Drainage bag/tubing not touching floor;No dependent loops;Seal intact 04/08/2018  8:00 PM  Collection Container Standard drainage bag 04/08/2018  8:00 PM  Securement Method Securing device (Describe) 04/08/2018  8:00 PM  Urinary Catheter Interventions Unclamped 04/08/2018  8:00 PM  Output (mL) 167 mL 04/09/2018  6:00 AM    Microbiology/Sepsis markers: Results for orders placed or performed during the hospital encounter of March 14, 2019  Urine culture     Status: None   Collection Time: 04/04/18  1:42 AM  Result Value Ref Range Status   Specimen Description URINE, CATHETERIZED  Final   Special Requests NONE  Final   Culture   Final    NO GROWTH Performed at [facility], [address] [place], [place] [address]    Report Status 04/01/2018 FINAL  Final  MRSA PCR Screening     Status: Abnormal   Collection Time: 04/04/18  3:18 AM  Result Value Ref Range Status   MRSA by PCR POSITIVE (A) NEGATIVE Final    Comment:        The GeneXpert MRSA Assay (FDA approved for NASAL specimens only), is one component of a comprehensive MRSA colonization  surveillance program. It is not intended to diagnose MRSA infection nor to guide or monitor treatment for MRSA infections. RESULT CALLED TO, READ BACK BY AND VERIFIED WITH: [name] RN [id number] 04/04/18 [name] Performed at [facility], [address] [place], [place] [address]     Anti-infectives:  Anti-infectives (From admission, onward)   Start     Dose/Rate Route Frequency Ordered Stop   Apr 27, 2018 1800  ceFAZolin (ANCEF) IVPB 2g/100 mL premix     2 g 200 mL/hr  over 30 Minutes Intravenous Every 8 hours 04-27-18 1609 04/06/18 1520   April 27, 2018 1258  vancomycin (VANCOCIN) powder  Status:  Discontinued       As needed Apr 27, 2018 1258 04-27-18 1534   Apr 27, 2018 1000  vancomycin (VANCOCIN) IVPB 1000 mg/200 mL premix     1,000 mg 200 mL/hr over 60 Minutes Intravenous To ShortStay Surgical 04/04/18 1257 2018-04-27 1242   04/27/18 0930  ceFAZolin (ANCEF) IVPB 2g/100 mL premix  Status:  Discontinued     2 g 200 mL/hr over 30 Minutes Intravenous To ShortStay Surgical April 27, 2018 0712 04/27/2018 1540   04/04/18 0930  ceFAZolin (ANCEF) IVPB 1 g/50 mL premix  Status:  Discontinued     1 g 100 mL/hr over 30 Minutes Intravenous Every 8 hours 04/04/18 0840 04-27-18 1609      Best Practice/Protocols:  VTE Prophylaxis: Mechanical Continous Sedation  Consults: Treatment Team:  Tia Alert, MD Haddix, Gillie Manners, MD    Studies:    Events:  Subjective:    Overnight Issues:   Objective:  Vital signs for last 24 hours: Temp:  [99.2 F (37.3 C)-99.8 F (37.7 C)] 99.2 F (37.3 C) (01/21 0400) Pulse Rate:  [73-147] 75 (01/21 0700) Resp:  [0-40] 16 (01/21 0700) BP: (102-207)/(51-139) 150/54 (01/21 0700) SpO2:  [98 %-100 %] 99 % (01/21 0700) Arterial Line BP: (107-207)/(51-89) 173/53 (01/21 0700) FiO2 (%):  [30 %-50 %] 30 % (01/21 0700)  Hemodynamic parameters for last 24 hours:    Intake/Output from previous day: 01/20 0701 - 01/21 0700 In: 3180.8 [I.V.:1605.7; NG/GT:1525; IV Piggyback:50.1] Out: 2052 [Urine:2052]  Intake/Output this shift: No intake/output data recorded.  Vent settings for last 24 hours: Vent Mode: PRVC FiO2 (%):  [30 %-50 %] 30 % Set Rate:  [16 bmp] 16 bmp Vt Set:  [550 mL] 550 mL PEEP:  [5 cmH20] 5 cmH20 Plateau Pressure:  [15 cmH20-20 cmH20] 16 cmH20  Physical Exam:  General: on vnet Neuro: sedated HEENT/Neck: ETT and collar Resp: few rhonchi on R CVS: RRR GI: soft, nontender, BS WNL, no r/g Extremities: splint LUE and  BLE  Results for orders placed or performed during the hospital encounter of 03/29/2018 (from the past 24 hour(s))  Basic metabolic panel     Status: Abnormal   Collection Time: 04/08/18  8:49 AM  Result Value Ref Range   Sodium 153 (H) 135 - 145 mmol/L   Potassium 5.1 3.5 - 5.1 mmol/L   Chloride 128 (H) 98 - 111 mmol/L   CO2 19 (L) 22 - 32 mmol/L   Glucose, Bld 136 (H) 70 - 99 mg/dL   BUN 19 8 - 23 mg/dL   Creatinine, Ser 1.75 0.44 - 1.00 mg/dL   Calcium 6.5 (L) 8.9 - 10.3 mg/dL   GFR calc non Af Amer >60 >60 mL/min   GFR calc Af Amer >60 >60 mL/min   Anion gap 6 5 - 15  CBC     Status: Abnormal   Collection  Time: 04/08/18  8:49 AM  Result Value Ref Range   WBC 8.6 4.0 - 10.5 K/uL   RBC 2.82 (L) 3.87 - 5.11 MIL/uL   Hemoglobin 8.3 (L) 12.0 - 15.0 g/dL   HCT 16.1 (L) 09.6 - 04.5 %   MCV 94.0 80.0 - 100.0 fL   MCH 29.4 26.0 - 34.0 pg   MCHC 31.3 30.0 - 36.0 g/dL   RDW 40.9 (H) 81.1 - 91.4 %   Platelets 120 (L) 150 - 400 K/uL   nRBC 0.2 0.0 - 0.2 %  Glucose, capillary     Status: Abnormal   Collection Time: 04/08/18 11:23 AM  Result Value Ref Range   Glucose-Capillary 118 (H) 70 - 99 mg/dL  Glucose, capillary     Status: Abnormal   Collection Time: 04/08/18  3:18 PM  Result Value Ref Range   Glucose-Capillary 152 (H) 70 - 99 mg/dL  Glucose, capillary     Status: Abnormal   Collection Time: 04/08/18  7:27 PM  Result Value Ref Range   Glucose-Capillary 133 (H) 70 - 99 mg/dL  Glucose, capillary     Status: Abnormal   Collection Time: 04/08/18 11:11 PM  Result Value Ref Range   Glucose-Capillary 145 (H) 70 - 99 mg/dL  Glucose, capillary     Status: Abnormal   Collection Time: 04/09/18  3:41 AM  Result Value Ref Range   Glucose-Capillary 141 (H) 70 - 99 mg/dL  Basic metabolic panel     Status: Abnormal   Collection Time: 04/09/18  7:00 AM  Result Value Ref Range   Sodium 147 (H) 135 - 145 mmol/L   Potassium 3.1 (L) 3.5 - 5.1 mmol/L   Chloride 119 (H) 98 - 111 mmol/L    CO2 22 22 - 32 mmol/L   Glucose, Bld 160 (H) 70 - 99 mg/dL   BUN 25 (H) 8 - 23 mg/dL   Creatinine, Ser 7.82 0.44 - 1.00 mg/dL   Calcium 7.6 (L) 8.9 - 10.3 mg/dL   GFR calc non Af Amer >60 >60 mL/min   GFR calc Af Amer >60 >60 mL/min   Anion gap 6 5 - 15  CBC     Status: Abnormal   Collection Time: 04/09/18  7:00 AM  Result Value Ref Range   WBC 8.3 4.0 - 10.5 K/uL   RBC 2.90 (L) 3.87 - 5.11 MIL/uL   Hemoglobin 8.3 (L) 12.0 - 15.0 g/dL   HCT 95.6 (L) 21.3 - 08.6 %   MCV 93.4 80.0 - 100.0 fL   MCH 28.6 26.0 - 34.0 pg   MCHC 30.6 30.0 - 36.0 g/dL   RDW 57.8 (H) 46.9 - 62.9 %   Platelets 149 (L) 150 - 400 K/uL   nRBC 0.4 (H) 0.0 - 0.2 %  Glucose, capillary     Status: Abnormal   Collection Time: 04/09/18  7:37 AM  Result Value Ref Range   Glucose-Capillary 170 (H) 70 - 99 mg/dL   Comment 1 Notify RN    Comment 2 Document in Chart     Assessment & Plan: Present on Admission: **None**    LOS: 5 days   Additional comments:I reviewed the patient's new clinical lab test results. and CXR Patient ID: Orissa Arreaga, female   DOB: Aug 14, 1945, 73 y.o.   MRN: [id number] Follow up - Trauma Critical Care  Patient Details:    Phelicia Dantes is an 73 y.o. female.  Lines/tubes : Airway 7.5 mm (Active)  Secured at (  cm) 25 cm 04/08/2018  7:48 AM  Measured From Lips 04/08/2018  7:48 AM  Secured Location Left 04/08/2018  7:48 AM  Secured By [employer] Tube Holder 04/08/2018  7:48 AM  Tube Holder Repositioned Yes 04/08/2018  7:48 AM  Cuff Pressure (cm H2O) 28 cm H2O 04/08/2018  7:48 AM  Site Condition Dry 04/08/2018  7:48 AM     PICC Double Lumen 04/04/18 PICC Right Basilic 48 cm 3 cm (Active)  Indication for Insertion or Continuance of Line Limited venous access - need for IV therapy >5 days (PICC only) 04/08/2018  7:47 AM  Exposed Catheter (cm) 3 cm 04/04/2018  2:00 PM  Site Assessment Clean;Dry;Intact 04/07/2018  8:00 PM  Lumen #1 Status Flushed;Blood return noted;Infusing 04/07/2018   8:00 PM  Lumen #2 Status In-line blood sampling system in place;Infusing 04/07/2018  8:00 PM  Dressing Type Transparent;Occlusive 04/07/2018  8:00 PM  Dressing Status Clean;Dry;Intact;Antimicrobial disc in place 04/07/2018  8:00 PM  Line Care Connections checked and tightened 04/07/2018  8:00 PM  Dressing Change Due 04/11/18 04/07/2018  8:00 PM     Arterial Line 04/14/2018 Radial (Active)  Site Assessment Clean;Dry;Intact 04/07/2018  8:00 PM  Line Status Pulsatile blood flow 04/07/2018  8:00 PM  Art Line Waveform Appropriate 04/07/2018  8:00 PM  Art Line Interventions Zeroed and calibrated 04/07/2018  8:00 AM  Color/Movement/Sensation Capillary refill less than 3 sec 04/07/2018  8:00 PM  Dressing Type Transparent;Occlusive 04/07/2018  8:00 PM  Dressing Status Clean;Dry;Intact;Antimicrobial disc in place 04/07/2018  8:00 PM  Dressing Change Due 04/12/18 04/07/2018  8:00 PM     NG/OG Tube Orogastric 18 Fr. Right mouth Xray (Active)  External Length of Tube (cm) - (if applicable) 67 cm 04/06/2018  8:00 AM  Site Assessment Clean;Dry;Intact 04/07/2018  8:00 PM  Ongoing Placement Verification No change in respiratory status;No acute changes, not attributed to clinical condition 04/07/2018  8:00 PM  Status Clamped 04/07/2018  8:00 PM     Urethral Catheter Junious Dresseronnie dupont Latex 14 Fr. (Active)  Indication for Insertion or Continuance of Catheter Acute urinary retention 04/08/2018  7:47 AM  Site Assessment Clean;Intact 04/07/2018  8:00 PM  Catheter Maintenance Bag below level of bladder;Catheter secured;Drainage bag/tubing not touching floor;No dependent loops;Seal intact;Bag emptied prior to transport;Insertion date on drainage bag 04/08/2018  7:47 AM  Collection Container Standard drainage bag 04/07/2018  8:00 PM  Securement Method Securing device (Describe) 04/07/2018  8:00 PM  Output (mL) 480 mL 04/07/2018  7:00 PM    Microbiology/Sepsis markers: Results for orders placed or performed during the hospital encounter  of 2018-11-11  Urine culture     Status: None   Collection Time: 04/04/18  1:42 AM  Result Value Ref Range Status   Specimen Description URINE, CATHETERIZED  Final   Special Requests NONE  Final   Culture   Final    NO GROWTH Performed at [facility], [address] [place], [place] [address]    Report Status 04/06/2018 FINAL  Final  MRSA PCR Screening     Status: Abnormal   Collection Time: 04/04/18  3:18 AM  Result Value Ref Range Status   MRSA by PCR POSITIVE (A) NEGATIVE Final    Comment:        The GeneXpert MRSA Assay (FDA approved for NASAL specimens only), is one component of a comprehensive MRSA colonization surveillance program. It is not intended to diagnose MRSA infection nor to guide or monitor treatment for MRSA infections. RESULT CALLED TO,  READ BACK BY AND VERIFIED WITH: [name] RN [id number] 04/04/18 [name] Performed at [facility], [address] [place], [place] [address]     Anti-infectives:  Anti-infectives (From admission, onward)   Start     Dose/Rate Route Frequency Ordered Stop   2018-04-20 1800  ceFAZolin (ANCEF) IVPB 2g/100 mL premix     2 g 200 mL/hr over 30 Minutes Intravenous Every 8 hours 04-20-18 1609 04/06/18 1520   04/20/2018 1258  vancomycin (VANCOCIN) powder  Status:  Discontinued       As needed 04-20-18 1258 Apr 20, 2018 1534   20-Apr-2018 1000  vancomycin (VANCOCIN) IVPB 1000 mg/200 mL premix     1,000 mg 200 mL/hr over 60 Minutes Intravenous To ShortStay Surgical 04/04/18 1257 April 20, 2018 1242   April 20, 2018 0930  ceFAZolin (ANCEF) IVPB 2g/100 mL premix  Status:  Discontinued     2 g 200 mL/hr over 30 Minutes Intravenous To ShortStay Surgical 04-20-18 0712 04/20/18 1540   04/04/18 0930  ceFAZolin (ANCEF) IVPB 1 g/50 mL premix  Status:  Discontinued     1 g 100 mL/hr over 30 Minutes Intravenous Every 8 hours 04/04/18 0840 04-20-18 1609      Best Practice/Protocols:  VTE Prophylaxis: Mechanical Continous  Sedation  Consults: Treatment Team:  Tia Alert, MD Haddix, Gillie Manners, MD   Subjective:    Overnight Issues:   Objective:  Vital signs for last 24 hours: Temp:  [99.2 F (37.3 C)-100 F (37.8 C)] 99.7 F (37.6 C) (01/20 0800) Pulse Rate:  [74-110] 107 (01/20 0748) Resp:  [13-32] 21 (01/20 0748) BP: (148-184)/(53-72) 148/53 (01/20 0748) SpO2:  [100 %] 100 % (01/20 0748) Arterial Line BP: (142-196)/(49-68) 142/60 (01/20 0600) FiO2 (%):  [30 %] 30 % (01/20 0748)  Hemodynamic parameters for last 24 hours:    Intake/Output from previous day: 01/19 0701 - 01/20 0700 In: 3511.6 [I.V.:2431.6; NG/GT:1080] Out: 905 [Urine:905]  Intake/Output this shift: No intake/output data recorded.  Vent settings for last 24 hours: Vent Mode: CPAP;PSV FiO2 (%):  [30 %] 30 % Set Rate:  [16 bmp] 16 bmp Vt Set:  [550 mL] 550 mL PEEP:  [5 cmH20] 5 cmH20 Pressure Support:  [8 cmH20-10 cmH20] 10 cmH20 Plateau Pressure:  [19 cmH20-21 cmH20] 19 cmH20  Physical Exam:  General: on vent Neuro: opens eyes to voice, F/C UE and LE HEENT/Neck: ETT and collar Resp: clear to auscultation bilaterally CVS: RRR GI: soft, nontender, BS WNL, no r/g Extremities: splint LUE, BLE  Results for orders placed or performed during the hospital encounter of 03/25/2018 (from the past 24 hour(s))  Glucose, capillary     Status: Abnormal   Collection Time: 04/07/18 11:09 AM  Result Value Ref Range   Glucose-Capillary 117 (H) 70 - 99 mg/dL   Comment 1 Notify RN    Comment 2 Document in Chart   Glucose, capillary     Status: Abnormal   Collection Time: 04/07/18  3:22 PM  Result Value Ref Range   Glucose-Capillary 157 (H) 70 - 99 mg/dL   Comment 1 Notify RN    Comment 2 Document in Chart   Glucose, capillary     Status: Abnormal   Collection Time: 04/07/18  7:13 PM  Result Value Ref Range   Glucose-Capillary 147 (H) 70 - 99 mg/dL  Glucose, capillary     Status: Abnormal   Collection Time: 04/07/18 11:16 PM   Result Value Ref Range   Glucose-Capillary 127 (H) 70 - 99 mg/dL  Glucose, capillary     Status: Abnormal   Collection Time: 04/08/18  3:15 AM  Result Value Ref Range   Glucose-Capillary 141 (H) 70 - 99 mg/dL  Glucose, capillary     Status: Abnormal   Collection Time: 04/08/18  7:34 AM  Result Value Ref Range   Glucose-Capillary 129 (H) 70 - 99 mg/dL    Assessment & Plan: Present on Admission: **None**    LOS: 4 days   Additional comments:I reviewed the patient's new clinical lab test results. Marland Kitchen PHBC TBI/SAH - per Dr. Yetta Barre, F/U CT head 1/17 with some increase in L FICC and R SAH C1-2 FXs - collar per Dr. Yetta Barre B rib FX (R 2-9; L2, 4-7) with small R PTX Acute hypoxic ventilator dependent respiratory failure - failed extubation 1/20. Wean.  HTN - increase scheduled lopressor, hydralazine PRN Acute urinary retention - foley, start urecholine ID - afeb and WBC OK but CXR looks like PNA R - check resp culture ABL anemia  L ulna and 4th finger FX - ORIF L ulna 1/17 Dr. Jena Gauss  R scapula FX - per Dr. Jena Gauss B tibial plateau FX - ORIF L side 1/17 Dr. Jena Gauss  R fibula FX - per Dr. Jena Gauss R bimal ankle FX - ORIF 1/17 Dr. Jena Gauss Hyperglycemia - SSI Thrombocytopenia - resolving HX schizophrenia per family - has not been on meds since last summer VTE - plexipulse, will start Lovenox tomorrow per Dr. Yetta Barre FEN - replete hypokalemia, free water for hypernatremia (improving) Dispo - ICU, Will discuss plan for possible trach and PEG with family Critical Care Total Time*: 58 Minutes  Violeta Gelinas, MD, MPH, FACS Trauma: [phone] General Surgery: [phone]  04/08/2018  *Care during the described time interval was provided by me. I have reviewed this patient's available data, including medical history, events of note, physical examination and test results as part of my evaluation.   Critical Care Total Time*: 40 Minutes  Violeta Gelinas, MD, MPH, [facility] Trauma:  [phone] General Surgery: [phone]  04/09/2018  *Care during the described time interval was provided by me. I have reviewed this patient's available data, including medical history, events of note, physical examination and test results as part of my evaluation.  Patient ID: Anilah Huck, female   DOB: [birth year], 73 y.o.   MRN: [id number]

## 2018-04-10 LAB — CBC
HCT: 27.3 % — ABNORMAL LOW (ref 36.0–46.0)
Hemoglobin: 8.2 g/dL — ABNORMAL LOW (ref 12.0–15.0)
MCH: 28.4 pg (ref 26.0–34.0)
MCHC: 30 g/dL (ref 30.0–36.0)
MCV: 94.5 fL (ref 80.0–100.0)
Platelets: 182 10*3/uL (ref 150–400)
RBC: 2.89 MIL/uL — ABNORMAL LOW (ref 3.87–5.11)
RDW: 16.8 % — ABNORMAL HIGH (ref 11.5–15.5)
WBC: 7.5 10*3/uL (ref 4.0–10.5)
nRBC: 0.7 % — ABNORMAL HIGH (ref 0.0–0.2)

## 2018-04-10 LAB — BASIC METABOLIC PANEL
Anion gap: 7 (ref 5–15)
BUN: 28 mg/dL — ABNORMAL HIGH (ref 8–23)
CO2: 23 mmol/L (ref 22–32)
CREATININE: 0.79 mg/dL (ref 0.44–1.00)
Calcium: 7.9 mg/dL — ABNORMAL LOW (ref 8.9–10.3)
Chloride: 117 mmol/L — ABNORMAL HIGH (ref 98–111)
GFR calc Af Amer: >60 mL/min (ref >60)
GFR calc non Af Amer: >60 mL/min (ref >60)
Glucose, Bld: 160 mg/dL — ABNORMAL HIGH (ref 70–99)
Potassium: 3.7 mmol/L (ref 3.5–5.1)
Sodium: 147 mmol/L — ABNORMAL HIGH (ref 135–145)

## 2018-04-10 LAB — GLUCOSE, CAPILLARY
GLUCOSE-CAPILLARY: 131 mg/dL — AB (ref 70–99)
Glucose-Capillary: 101 mg/dL — ABNORMAL HIGH (ref 70–99)
Glucose-Capillary: 160 mg/dL — ABNORMAL HIGH (ref 70–99)
Glucose-Capillary: 129 mg/dL — ABNORMAL HIGH (ref 70–99)
Glucose-Capillary: 132 mg/dL — ABNORMAL HIGH (ref 70–99)
Glucose-Capillary: 126 mg/dL — ABNORMAL HIGH (ref 70–99)

## 2018-04-10 MED ORDER — ENOXAPARIN SODIUM 40 MG/0.4ML ~~LOC~~ SOLN
40.0000 mg | SUBCUTANEOUS | Status: DC
  Administered 2018-04-10 – 2018-04-26 (×17): 40 mg via SUBCUTANEOUS
  Filled 2018-04-10 (×17): qty 0.4

## 2018-04-10 MED ORDER — PANTOPRAZOLE SODIUM 40 MG PO PACK
40.0000 mg | PACK | Freq: Every day | ORAL | Status: DC
  Administered 2018-04-10 – 2018-04-26 (×17): 40 mg
  Filled 2018-04-10 (×18): qty 20

## 2018-04-10 NOTE — Progress Notes (Signed)
Patient ID: Renee [name], female   DOB: 30-Apr-1945, 73 y.o.   MRN: [id number] Follow up - Trauma Critical Care  Patient Details:    Renee [name] is an 73 y.o. female.  Lines/tubes : Airway 7.5 mm (Active)  Secured at (cm) 26 cm 04/10/2018  3:15 AM  Measured From Lips 04/10/2018  3:15 AM  Secured Location Left 04/10/2018  3:15 AM  Secured By [employer] 04/10/2018  3:15 AM  Tube Holder Repositioned Yes 04/10/2018  3:15 AM  Cuff Pressure (cm H2O) 25 cm H2O 04/09/2018  7:35 PM  Site Condition Dry 04/10/2018  3:15 AM     PICC Double Lumen 04/04/18 PICC Right Basilic 48 cm 3 cm (Active)  Indication for Insertion or Continuance of Line Limited venous access - need for IV therapy >5 days (PICC only) 04/09/2018  8:00 PM  Exposed Catheter (cm) 3 cm 04/04/2018  2:00 PM  Site Assessment Clean;Dry;Intact 04/09/2018  8:00 PM  Lumen #1 Status Infusing 04/09/2018  8:00 PM  Lumen #2 Status In-line blood sampling system in place 04/09/2018  8:00 PM  Dressing Type Transparent;Occlusive 04/09/2018  8:00 PM  Dressing Status Clean;Dry;Intact;Antimicrobial disc in place 04/09/2018  8:00 PM  Line Care Connections checked and tightened 04/09/2018  8:00 PM  Dressing Change Due 04/11/18 04/09/2018  8:00 PM     Arterial Line 03/30/2018 Radial (Active)  Site Assessment Clean;Dry;Intact 04/09/2018  8:00 PM  Line Status Pulsatile blood flow 04/09/2018  8:00 PM  Art Line Waveform Appropriate 04/09/2018  8:00 PM  Art Line Interventions Zeroed and calibrated;Leveled 04/09/2018  8:00 PM  Color/Movement/Sensation Capillary refill less than 3 sec 04/09/2018  8:00 PM  Dressing Type Transparent;Occlusive 04/09/2018  8:00 PM  Dressing Status Clean;Dry;Intact;Antimicrobial disc in place 04/09/2018  8:00 PM  Dressing Change Due 04/12/18 04/09/2018  8:00 PM     NG/OG Tube Orogastric Center mouth Xray (Active)  Site Assessment Clean;Dry;Intact 04/09/2018  8:00 PM  Ongoing Placement Verification No change in cm markings or  external length of tube from initial placement;No change in respiratory status;No acute changes, not attributed to clinical condition;Xray 04/09/2018  8:00 PM  Status Infusing tube feed 04/09/2018  8:00 PM     Urethral Catheter Junious Dresser dupont Latex 14 Fr. (Active)  Indication for Insertion or Continuance of Catheter Acute urinary retention 04/09/2018  8:00 PM  Site Assessment Clean;Intact 04/09/2018  8:00 PM  Catheter Maintenance Bag below level of bladder;Catheter secured;Drainage bag/tubing not touching floor;No dependent loops;Seal intact 04/09/2018  8:00 PM  Collection Container Standard drainage bag 04/09/2018  8:00 PM  Securement Method Securing device (Describe) 04/09/2018  8:00 PM  Urinary Catheter Interventions Unclamped 04/09/2018  8:00 PM  Output (mL) 183 mL 04/10/2018  6:00 AM    Microbiology/Sepsis markers: Results for orders placed or performed during the hospital encounter of April 27, 2018  Urine culture     Status: None   Collection Time: 04/04/18  1:42 AM  Result Value Ref Range Status   Specimen Description URINE, CATHETERIZED  Final   Special Requests NONE  Final   Culture   Final    NO GROWTH Performed at [facility], [address] [place], [place] [address]    Report Status 04/11/2018 FINAL  Final  MRSA PCR Screening     Status: Abnormal   Collection Time: 04/04/18  3:18 AM  Result Value Ref Range Status   MRSA by PCR POSITIVE (A) NEGATIVE Final    Comment:  The GeneXpert MRSA Assay (FDA approved for NASAL specimens only), is one component of a comprehensive MRSA colonization surveillance program. It is not intended to diagnose MRSA infection nor to guide or monitor treatment for MRSA infections. RESULT CALLED TO, READ BACK BY AND VERIFIED WITH: Rene KocherM PLUMMER RN [phone] 04/04/18 [name] Performed at [facility], [address] [place], [place] [address]   Culture, respiratory (non-expectorated)     Status: None (Preliminary result)   Collection  Time: 04/09/18  7:44 AM  Result Value Ref Range Status   Specimen Description TRACHEAL ASPIRATE  Final   Special Requests Normal  Final   Gram Stain   Final    MODERATE WBC PRESENT, PREDOMINANTLY PMN FEW GRAM NEGATIVE RODS RARE GRAM POSITIVE COCCI IN CLUSTERS Performed at [facility], [address] [place], [place] [address]    Culture PENDING  Incomplete   Report Status PENDING  Incomplete    Anti-infectives:  Anti-infectives (From admission, onward)   Start     Dose/Rate Route Frequency Ordered Stop   07/27/18 1800  ceFAZolin (ANCEF) IVPB 2g/100 mL premix     2 g 200 mL/hr over 30 Minutes Intravenous Every 8 hours 07/27/18 1609 04/06/18 1520   07/27/18 1258  vancomycin (VANCOCIN) powder  Status:  Discontinued       As needed 07/27/18 1258 07/27/18 1534   07/27/18 1000  vancomycin (VANCOCIN) IVPB 1000 mg/200 mL premix     1,000 mg 200 mL/hr over 60 Minutes Intravenous To ShortStay Surgical 04/04/18 1257 07/27/18 1242   07/27/18 0930  ceFAZolin (ANCEF) IVPB 2g/100 mL premix  Status:  Discontinued     2 g 200 mL/hr over 30 Minutes Intravenous To ShortStay Surgical 07/27/18 0712 07/27/18 1540   04/04/18 0930  ceFAZolin (ANCEF) IVPB 1 g/50 mL premix  Status:  Discontinued     1 g 100 mL/hr over 30 Minutes Intravenous Every 8 hours 04/04/18 0840 07/27/18 1609      Best Practice/Protocols:  VTE Prophylaxis: Lovenox (prophylaxtic dose) Continous Sedation  Consults: Treatment Team:  Tia AlertJones, David S, MD Haddix, Gillie MannersKevin P, MD   Subjective:    Overnight Issues:   Objective:  Vital signs for last 24 hours: Temp:  [98.9 F (37.2 C)-100.2 F (37.9 C)] 98.9 F (37.2 C) (01/22 0400) Pulse Rate:  [74-88] 75 (01/22 0700) Resp:  [0-25] 16 (01/22 0700) BP: (119-198)/(48-67) 142/60 (01/22 0700) SpO2:  [97 %-100 %] 97 % (01/22 0700) Arterial Line BP: (102-198)/(44-65) 158/59 (01/22 0700) FiO2 (%):  [30 %] 30 % (01/22 0700)  Hemodynamic parameters for last 24 hours:     Intake/Output from previous day: 01/21 0701 - 01/22 0700 In: 2403.1 [I.V.:1548.1; NG/GT:855] Out: 1799 [Urine:1799]  Intake/Output this shift: No intake/output data recorded.  Vent settings for last 24 hours: Vent Mode: PRVC FiO2 (%):  [30 %] 30 % Set Rate:  [16 bmp] 16 bmp Vt Set:  [550 mL] 550 mL PEEP:  [5 cmH20] 5 cmH20 Pressure Support:  [10 cmH20] 10 cmH20 Plateau Pressure:  [12 cmH20-19 cmH20] 19 cmH20  Physical Exam:  General: on vent Neuro: opens eyes to voice, occ F/C HEENT/Neck: ETT and collar Resp: clear to auscultation bilaterally CVS: reg with occasional ectopy GI: soft, nontender, BS WNL, no r/g Extremities: splint LUE and BLE  Results for orders placed or performed during the hospital encounter of 04/16/2018 (from the past 24 hour(s))  Culture, respiratory (non-expectorated)     Status: None (Preliminary result)   Collection  Time: 04/09/18  7:44 AM  Result Value Ref Range   Specimen Description TRACHEAL ASPIRATE    Special Requests Normal    Gram Stain      MODERATE WBC PRESENT, PREDOMINANTLY PMN FEW GRAM NEGATIVE RODS RARE GRAM POSITIVE COCCI IN CLUSTERS Performed at [facility], [address], [place], [place] [address]    Culture PENDING    Report Status PENDING   Glucose, capillary     Status: Abnormal   Collection Time: 04/09/18 11:43 AM  Result Value Ref Range   Glucose-Capillary 158 (H) 70 - 99 mg/dL   Comment 1 Notify RN    Comment 2 Document in Chart   Glucose, capillary     Status: Abnormal   Collection Time: 04/09/18  3:41 PM  Result Value Ref Range   Glucose-Capillary 105 (H) 70 - 99 mg/dL   Comment 1 Notify RN    Comment 2 Document in Chart   Glucose, capillary     Status: Abnormal   Collection Time: 04/09/18  7:40 PM  Result Value Ref Range   Glucose-Capillary 121 (H) 70 - 99 mg/dL  Glucose, capillary     Status: Abnormal   Collection Time: 04/09/18 11:12 PM  Result Value Ref Range   Glucose-Capillary 145 (H) 70 - 99  mg/dL  Glucose, capillary     Status: Abnormal   Collection Time: 04/10/18  3:13 AM  Result Value Ref Range   Glucose-Capillary 101 (H) 70 - 99 mg/dL  CBC     Status: Abnormal   Collection Time: 04/10/18  6:25 AM  Result Value Ref Range   WBC 7.5 4.0 - 10.5 K/uL   RBC 2.89 (L) 3.87 - 5.11 MIL/uL   Hemoglobin 8.2 (L) 12.0 - 15.0 g/dL   HCT 94.7 (L) 09.6 - 28.3 %   MCV 94.5 80.0 - 100.0 fL   MCH 28.4 26.0 - 34.0 pg   MCHC 30.0 30.0 - 36.0 g/dL   RDW 66.2 (H) 94.7 - 65.4 %   Platelets 182 150 - 400 K/uL   nRBC 0.7 (H) 0.0 - 0.2 %  Basic metabolic panel     Status: Abnormal   Collection Time: 04/10/18  6:25 AM  Result Value Ref Range   Sodium 147 (H) 135 - 145 mmol/L   Potassium 3.7 3.5 - 5.1 mmol/L   Chloride 117 (H) 98 - 111 mmol/L   CO2 23 22 - 32 mmol/L   Glucose, Bld 160 (H) 70 - 99 mg/dL   BUN 28 (H) 8 - 23 mg/dL   Creatinine, Ser 6.50 0.44 - 1.00 mg/dL   Calcium 7.9 (L) 8.9 - 10.3 mg/dL   GFR calc non Af Amer >60 >60 mL/min   GFR calc Af Amer >60 >60 mL/min   Anion gap 7 5 - 15    Assessment & Plan: Present on Admission: **None**    LOS: 6 days   Additional comments:I reviewed the patient's new clinical lab test results. Marland [facility] TBI/SAH - per Dr. Yetta Barre, F/U CT head 1/17 with some increase in L FICC and R SAH C1-2 FXs - collar per Dr. Yetta Barre B rib FX (R 2-9; L2, 4-7) with small R PTX Acute hypoxic ventilator dependent respiratory failure - failed extubation 1/20. Wean as able. Consider trach. HTN - better with increased Lopressor. Acute urinary retention - foley, urecholine, voiding trial 1/23 ID - afeb and WBC OK but CXR looks like PNA R - resp CX P 1/21 ABL anemia  L  ulna and 4th finger FX - ORIF L ulna 1/17 Dr. Jena GaussHaddix  R scapula FX - per Dr. Jena GaussHaddix B tibial plateau FX - ORIF L side 1/17 Dr. Jena GaussHaddix  R fibula FX - per Dr. Jena GaussHaddix R bimal ankle FX - ORIF 1/17 Dr. Jena GaussHaddix Hyperglycemia - SSI HX schizophrenia per family - has not been on meds since last  summer VTE - plexipulse, will start Lovenox today per Dr. Yetta BarreJones FEN - free water for hypernatremia (improving) Dispo - ICU, Will discuss plan for possible trach and PEG with family Critical Care Total Time*: 3341 Minutes  Violeta GelinasBurke Ellery Meroney, MD, MPH, FACS Trauma: [phone] General Surgery: [phone]  04/10/2018  *Care during the described time interval was provided by me. I have reviewed this patient's available data, including medical history, events of note, physical examination and test results as part of my evaluation.

## 2018-04-10 NOTE — Progress Notes (Signed)
Patient ID: Renee [name], female   DOB: Jul 01, 1945, 73 y.o.   MRN: [id number] I called her sister Renee [name] and updated her on Renee [name]'s condition. I began discussing trach/PEG with her. She wants to wait a little and see if Renee [name] wakes up any more. We will continue all current therapies and discuss things further over the next few days.  Renee Gelinas, MD, MPH, FACS Trauma: [phone] General Surgery: [phone]

## 2018-04-11 ENCOUNTER — Inpatient Hospital Stay (HOSPITAL_COMMUNITY): Payer: Medicare Other

## 2018-04-11 DIAGNOSIS — S12000A Unspecified displaced fracture of first cervical vertebra, initial encounter for closed fracture: Secondary | ICD-10-CM

## 2018-04-11 DIAGNOSIS — S2243XA Multiple fractures of ribs, bilateral, initial encounter for closed fracture: Secondary | ICD-10-CM

## 2018-04-11 DIAGNOSIS — S82141A Displaced bicondylar fracture of right tibia, initial encounter for closed fracture: Secondary | ICD-10-CM

## 2018-04-11 DIAGNOSIS — S82142A Displaced bicondylar fracture of left tibia, initial encounter for closed fracture: Secondary | ICD-10-CM

## 2018-04-11 DIAGNOSIS — S42101A Fracture of unspecified part of scapula, right shoulder, initial encounter for closed fracture: Secondary | ICD-10-CM

## 2018-04-11 DIAGNOSIS — S52202A Unspecified fracture of shaft of left ulna, initial encounter for closed fracture: Secondary | ICD-10-CM

## 2018-04-11 DIAGNOSIS — S82841A Displaced bimalleolar fracture of right lower leg, initial encounter for closed fracture: Secondary | ICD-10-CM

## 2018-04-11 DIAGNOSIS — S82112A Displaced fracture of left tibial spine, initial encounter for closed fracture: Secondary | ICD-10-CM

## 2018-04-11 DIAGNOSIS — I609 Nontraumatic subarachnoid hemorrhage, unspecified: Secondary | ICD-10-CM

## 2018-04-11 LAB — BASIC METABOLIC PANEL
Anion gap: 5 (ref 5–15)
BUN: 26 mg/dL — ABNORMAL HIGH (ref 8–23)
CO2: 25 mmol/L (ref 22–32)
Calcium: 7.8 mg/dL — ABNORMAL LOW (ref 8.9–10.3)
Chloride: 113 mmol/L — ABNORMAL HIGH (ref 98–111)
Creatinine, Ser: 0.82 mg/dL (ref 0.44–1.00)
GFR calc non Af Amer: >60 mL/min (ref >60)
Glucose, Bld: 170 mg/dL — ABNORMAL HIGH (ref 70–99)
Potassium: 3.4 mmol/L — ABNORMAL LOW (ref 3.5–5.1)
Sodium: 143 mmol/L (ref 135–145)

## 2018-04-11 LAB — GLUCOSE, CAPILLARY
GLUCOSE-CAPILLARY: 121 mg/dL — AB (ref 70–99)
Glucose-Capillary: 135 mg/dL — ABNORMAL HIGH (ref 70–99)
Glucose-Capillary: 159 mg/dL — ABNORMAL HIGH (ref 70–99)
Glucose-Capillary: 168 mg/dL — ABNORMAL HIGH (ref 70–99)
Glucose-Capillary: 197 mg/dL — ABNORMAL HIGH (ref 70–99)
Glucose-Capillary: 214 mg/dL — ABNORMAL HIGH (ref 70–99)

## 2018-04-11 LAB — CBC
HCT: 26.1 % — ABNORMAL LOW (ref 36.0–46.0)
Hemoglobin: 7.8 g/dL — ABNORMAL LOW (ref 12.0–15.0)
MCH: 28.4 pg (ref 26.0–34.0)
MCHC: 29.9 g/dL — ABNORMAL LOW (ref 30.0–36.0)
MCV: 94.9 fL (ref 80.0–100.0)
Platelets: 224 10*3/uL (ref 150–400)
RBC: 2.75 MIL/uL — ABNORMAL LOW (ref 3.87–5.11)
RDW: 16.6 % — ABNORMAL HIGH (ref 11.5–15.5)
WBC: 8.4 10*3/uL (ref 4.0–10.5)
nRBC: 0.6 % — ABNORMAL HIGH (ref 0.0–0.2)

## 2018-04-11 LAB — CULTURE, RESPIRATORY W GRAM STAIN: Special Requests: NORMAL

## 2018-04-11 LAB — CULTURE, RESPIRATORY

## 2018-04-11 MED ORDER — POLYETHYLENE GLYCOL 3350 17 G PO PACK
17.0000 g | PACK | Freq: Every day | ORAL | Status: DC
  Administered 2018-04-11 – 2018-04-26 (×8): 17 g
  Filled 2018-04-11 (×11): qty 1

## 2018-04-11 MED ORDER — POTASSIUM CHLORIDE 20 MEQ/15ML (10%) PO SOLN
40.0000 meq | Freq: Two times a day (BID) | ORAL | Status: AC
  Administered 2018-04-11 (×2): 40 meq
  Filled 2018-04-11 (×2): qty 30

## 2018-04-11 MED ORDER — SODIUM CHLORIDE 0.9 % IV SOLN
1.0000 g | Freq: Two times a day (BID) | INTRAVENOUS | Status: DC
  Administered 2018-04-11 – 2018-04-12 (×3): 1 g via INTRAVENOUS
  Filled 2018-04-11 (×3): qty 1

## 2018-04-11 MED ORDER — POTASSIUM CHLORIDE 20 MEQ/15ML (10%) PO SOLN: 40.0000 meq | Freq: Once | ORAL | Status: DC

## 2018-04-11 MED ORDER — BISACODYL 10 MG RE SUPP
10.0000 mg | Freq: Every day | RECTAL | Status: DC | PRN
  Administered 2018-04-11: 10 mg via RECTAL
  Filled 2018-04-11: qty 1

## 2018-04-11 MED ORDER — FREE WATER
200.0000 mL | Freq: Three times a day (TID) | Status: DC
  Administered 2018-04-11 – 2018-04-26 (×46): 200 mL

## 2018-04-11 MED ORDER — GUAIFENESIN 100 MG/5ML PO SOLN
15.0000 mL | Freq: Four times a day (QID) | ORAL | Status: DC
  Administered 2018-04-11 – 2018-04-26 (×59): 300 mg
  Filled 2018-04-11 (×6): qty 15
  Filled 2018-04-11: qty 45
  Filled 2018-04-11 (×14): qty 15
  Filled 2018-04-11: qty 45
  Filled 2018-04-11 (×3): qty 15
  Filled 2018-04-11: qty 45
  Filled 2018-04-11 (×10): qty 15
  Filled 2018-04-11: qty 30
  Filled 2018-04-11 (×3): qty 15
  Filled 2018-04-11: qty 45
  Filled 2018-04-11 (×8): qty 15
  Filled 2018-04-11: qty 30
  Filled 2018-04-11: qty 15
  Filled 2018-04-11: qty 10
  Filled 2018-04-11 (×2): qty 15
  Filled 2018-04-11: qty 45
  Filled 2018-04-11 (×5): qty 15

## 2018-04-11 MED ORDER — FUROSEMIDE 10 MG/ML IJ SOLN
40.0000 mg | Freq: Once | INTRAMUSCULAR | Status: AC
  Administered 2018-04-11: 40 mg via INTRAVENOUS
  Filled 2018-04-11: qty 4

## 2018-04-11 NOTE — Progress Notes (Signed)
Patient ID: Renee BeckwithLena Kelly Randon, female   DOB: 12/10/1945, 73 y.o.   MRN: [id number] Follow up - Trauma Critical Care  Patient Details:    Renee [name] is an 73 y.o. female.  Lines/tubes : Airway 7.5 mm (Active)  Secured at (cm) 26 cm 04/11/2018  3:40 AM  Measured From Lips 04/11/2018  3:40 AM  Secured Location Left 04/11/2018  3:40 AM  Secured By [employer] Tube Holder 04/11/2018  3:40 AM  Tube Holder Repositioned Yes 04/11/2018  3:40 AM  Cuff Pressure (cm H2O) 22 cm H2O 04/10/2018  8:11 PM  Site Condition Dry 04/11/2018  3:40 AM     PICC Double Lumen 04/04/18 PICC Right Basilic 48 cm 3 cm (Active)  Indication for Insertion or Continuance of Line Limited venous access - need for IV therapy >5 days (PICC only) 04/10/2018  8:00 PM  Exposed Catheter (cm) 3 cm 04/10/2018  8:00 AM  Site Assessment Clean;Dry;Intact 04/10/2018  8:00 PM  Lumen #1 Status Infusing 04/10/2018  8:00 PM  Lumen #2 Status In-line blood sampling system in place;Flushed 04/10/2018  8:00 PM  Dressing Type Transparent;Occlusive;Securing device 04/10/2018  8:00 PM  Dressing Status Clean;Dry;Intact;Antimicrobial disc in place 04/10/2018  8:00 PM  Line Care Connections checked and tightened 04/10/2018  8:00 PM  Dressing Intervention Dressing changed;Antimicrobial disc changed;Securement device changed 04/10/2018  5:00 PM  Dressing Change Due 04/17/18 04/10/2018  8:00 PM     Arterial Line 03/26/2018 Radial (Active)  Site Assessment Clean;Dry;Intact 04/10/2018  8:00 PM  Line Status Pulsatile blood flow 04/10/2018  8:00 PM  Art Line Waveform Appropriate 04/10/2018  8:00 PM  Art Line Interventions Zeroed and calibrated;Leveled;Connections checked and tightened;Flushed per protocol 04/10/2018  8:00 PM  Color/Movement/Sensation Capillary refill less than 3 sec 04/10/2018  8:00 PM  Dressing Type Transparent;Occlusive 04/10/2018  8:00 PM  Dressing Status Clean;Dry;Intact;Antimicrobial disc in place 04/10/2018  8:00 PM  Dressing Change Due 04/12/18  04/10/2018  8:00 PM     NG/OG Tube Orogastric Center mouth Xray (Active)  External Length of Tube (cm) - (if applicable) 62 cm 04/10/2018  8:00 PM  Site Assessment Clean;Dry;Intact 04/10/2018  8:00 PM  Ongoing Placement Verification No change in cm markings or external length of tube from initial placement;No change in respiratory status;No acute changes, not attributed to clinical condition;Xray 04/10/2018  8:00 PM  Status Infusing tube feed 04/10/2018  8:00 PM     External Urinary Catheter (Active)  Output (mL) 0 mL 04/11/2018  5:30 AM    Microbiology/Sepsis markers: Results for orders placed or performed during the hospital encounter of 04/13/2018  Urine culture     Status: None   Collection Time: 04/04/18  1:42 AM  Result Value Ref Range Status   Specimen Description URINE, CATHETERIZED  Final   Special Requests NONE  Final   Culture   Final    NO GROWTH Performed at [facility], [address] [place], [place] [address]    Report Status 04/15/2018 FINAL  Final  MRSA PCR Screening     Status: Abnormal   Collection Time: 04/04/18  3:18 AM  Result Value Ref Range Status   MRSA by PCR POSITIVE (A) NEGATIVE Final    Comment:        The GeneXpert MRSA Assay (FDA approved for NASAL specimens only), is one component of a comprehensive MRSA colonization surveillance program. It is not intended to diagnose MRSA infection nor to guide or monitor treatment for MRSA infections. RESULT CALLED TO, READ  BACK BY AND VERIFIED WITH: [name] RN [id number] 04/04/18 [name] Performed at [facility], [address], [place], [place] [address]   Culture, respiratory (non-expectorated)     Status: None (Preliminary result)   Collection Time: 04/09/18  7:44 AM  Result Value Ref Range Status   Specimen Description TRACHEAL ASPIRATE  Final   Special Requests Normal  Final   Gram Stain   Final    MODERATE WBC PRESENT, PREDOMINANTLY PMN FEW GRAM NEGATIVE RODS RARE GRAM POSITIVE COCCI IN  CLUSTERS Performed at [facility], [address], [place], [place] [address]    Culture FEW ESCHERICHIA COLI  Final   Report Status PENDING  Incomplete    Anti-infectives:  Anti-infectives (From admission, onward)   Start     Dose/Rate Route Frequency Ordered Stop   04/11/18 1000  ceFEPIme (MAXIPIME) 1 g in sodium chloride 0.9 % 100 mL IVPB     1 g 200 mL/hr over 30 Minutes Intravenous Every 12 hours 04/11/18 0732     05-04-18 1800  ceFAZolin (ANCEF) IVPB 2g/100 mL premix     2 g 200 mL/hr over 30 Minutes Intravenous Every 8 hours 05-04-18 1609 04/06/18 1520   2018/05/04 1258  vancomycin (VANCOCIN) powder  Status:  Discontinued       As needed 05-04-18 1258 04-May-2018 1534   05-04-18 1000  vancomycin (VANCOCIN) IVPB 1000 mg/200 mL premix     1,000 mg 200 mL/hr over 60 Minutes Intravenous To ShortStay Surgical 04/04/18 1257 May 04, 2018 1242   2018/05/04 0930  ceFAZolin (ANCEF) IVPB 2g/100 mL premix  Status:  Discontinued     2 g 200 mL/hr over 30 Minutes Intravenous To ShortStay Surgical May 04, 2018 0712 2018/05/04 1540   04/04/18 0930  ceFAZolin (ANCEF) IVPB 1 g/50 mL premix  Status:  Discontinued     1 g 100 mL/hr over 30 Minutes Intravenous Every 8 hours 04/04/18 0840 2018-05-04 1609      Best Practice/Protocols:  VTE Prophylaxis: Lovenox (prophylaxtic dose) Continous Sedation  Consults: Treatment Team:  Tia Alert, MD Roby Lofts, MD    Studies:    Events:  Subjective:    Overnight Issues:   Objective:  Vital signs for last 24 hours: Temp:  [99.8 F (37.7 C)-101.2 F (38.4 C)] 100 F (37.8 C) (01/23 0400) Pulse Rate:  [79-112] 88 (01/23 0700) Resp:  [0-28] 15 (01/23 0700) BP: (116-197)/(51-76) 174/59 (01/23 0700) SpO2:  [97 %-100 %] 99 % (01/23 0700) Arterial Line BP: (118-196)/(50-79) 185/65 (01/23 0700) FiO2 (%):  [30 %] 30 % (01/23 0340)  Hemodynamic parameters for last 24 hours:    Intake/Output from previous day: 01/22 0701 - 01/23 0700 In:  3967 [I.V.:1487; NG/GT:2480] Out: 1615 [Urine:1615]  Intake/Output this shift: No intake/output data recorded.  Vent settings for last 24 hours: Vent Mode: PRVC FiO2 (%):  [30 %] 30 % Set Rate:  [15 bmp] 15 bmp Vt Set:  [480 mL] 480 mL PEEP:  [5 cmH20] 5 cmH20 Pressure Support:  [10 cmH20] 10 cmH20 Plateau Pressure:  [10 cmH20-17 cmH20] 17 cmH20  Physical Exam:  General: ON VENT Neuro: opens eyes to voice but not F/C HEENT/Neck: ETT and collar Resp: some rhonchi B' CVS: reg with occ ectopy GI: soft, NT, +BS Extremities: ortho splints some edema  Results for orders placed or performed during the hospital encounter of 04/02/2018 (from the past 24 hour(s))  Glucose, capillary     Status: Abnormal   Collection Time: 04/10/18  11:26 AM  Result Value Ref Range   Glucose-Capillary 160 (H) 70 - 99 mg/dL   Comment 1 Notify RN    Comment 2 Document in Chart   Glucose, capillary     Status: Abnormal   Collection Time: 04/10/18  3:40 PM  Result Value Ref Range   Glucose-Capillary 129 (H) 70 - 99 mg/dL   Comment 1 Notify RN    Comment 2 Document in Chart   Glucose, capillary     Status: Abnormal   Collection Time: 04/10/18  7:39 PM  Result Value Ref Range   Glucose-Capillary 132 (H) 70 - 99 mg/dL  Glucose, capillary     Status: Abnormal   Collection Time: 04/10/18 11:07 PM  Result Value Ref Range   Glucose-Capillary 126 (H) 70 - 99 mg/dL  Glucose, capillary     Status: Abnormal   Collection Time: 04/11/18  3:20 AM  Result Value Ref Range   Glucose-Capillary 121 (H) 70 - 99 mg/dL  CBC     Status: Abnormal   Collection Time: 04/11/18  6:04 AM  Result Value Ref Range   WBC 8.4 4.0 - 10.5 K/uL   RBC 2.75 (L) 3.87 - 5.11 MIL/uL   Hemoglobin 7.8 (L) 12.0 - 15.0 g/dL   HCT 12.4 (L) 58.0 - 99.8 %   MCV 94.9 80.0 - 100.0 fL   MCH 28.4 26.0 - 34.0 pg   MCHC 29.9 (L) 30.0 - 36.0 g/dL   RDW 33.8 (H) 25.0 - 53.9 %   Platelets 224 150 - 400 K/uL   nRBC 0.6 (H) 0.0 - 0.2 %  Basic  metabolic panel     Status: Abnormal   Collection Time: 04/11/18  6:04 AM  Result Value Ref Range   Sodium 143 135 - 145 mmol/L   Potassium 3.4 (L) 3.5 - 5.1 mmol/L   Chloride 113 (H) 98 - 111 mmol/L   CO2 25 22 - 32 mmol/L   Glucose, Bld 170 (H) 70 - 99 mg/dL   BUN 26 (H) 8 - 23 mg/dL   Creatinine, Ser 7.67 0.44 - 1.00 mg/dL   Calcium 7.8 (L) 8.9 - 10.3 mg/dL   GFR calc non Af Amer >60 >60 mL/min   GFR calc Af Amer >60 >60 mL/min   Anion gap 5 5 - 15    Assessment & Plan: Present on Admission: **None**    LOS: 7 days   Additional comments:I reviewed the patient's new clinical lab test results. and CXR [facility] TBI/SAH - per Dr. Yetta Barre, F/U CT head 1/17 with some increase in L FICC and R SAH. Will repeat CT head in AM for prognostics. Agree with Dr. Yetta Barre aggressive care may not be indicated. It should help goals of care. C1-2 FXs - collar per Dr. Yetta Barre B rib FX (R 2-9; L2, 4-7) with small R PTX Acute hypoxic ventilator dependent respiratory failure - failed extubation 1/20. Wean as able. Consider trach - see below. Add guaifenesin for thick secretions. HTN - better with increased Lopressor. Acute urinary retention - foley out, needing I&O cath. Will follow protocol. ID - fever, e coli PNA - start Maxipime and await sensitivity ABL anemia  L ulna and 4th finger FX - ORIF L ulna 1/17 Dr. Jena Gauss  R scapula FX - per Dr. Jena Gauss B tibial plateau FX - ORIF L side 1/17 Dr. Jena Gauss  R fibula FX - per Dr. Jena Gauss R bimal ankle FX - ORIF 1/17 Dr. Jena Gauss Hyperglycemia - SSI HX schizophrenia per  family - has not been on meds since last summer VTE - plexipulse, Lovenox FEN - decrease free water and IVF, lasix X 1 Dispo - ICU, I spoke with her sister Deri Fuelling yesterday about trach/PEG. She wants to wait a little bit to see if Jinan wakes up at all. Prognosis for functional recovery is not looking good at this point. Of note, Marieme reportedly has a son who is currently incarcerated.  Critical Care  Total Time*: [address] Minutes  Violeta Gelinas, MD, MPH, [facility] Trauma: [phone] General Surgery: [phone]  04/11/2018  *Care during the described time interval was provided by me. I have reviewed this patient's available data, including medical history, events of note, physical examination and test results as part of my evaluation.
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
## 2018-04-11 NOTE — Progress Notes (Signed)
Pt voided via purewick. Bladder scanned pt, volume . I&O cath done, post-void cath residual volume. Will continue to closely monitor. See assoc charting in flowsheets.

## 2018-04-11 NOTE — Progress Notes (Signed)
Patient ID: Renee [name], female   DOB: 1945/12/29, 73 y.o.   MRN: [id number] Patient opens her eyes but very little other response to stimuli.  She is on sedating medications.  Would recommend follow-up CT scan of the head when feasible, though I am not sure aggressive care is indicated, for scanning would help more with prognostics than therapeutics

## 2018-04-11 NOTE — Progress Notes (Signed)
Pt has not voided since foley removed @ 0000. Discussed w/ Dr. Janee Morn, will I&O cath and continue to monitor.

## 2018-04-12 ENCOUNTER — Inpatient Hospital Stay (HOSPITAL_COMMUNITY): Payer: Medicare Other

## 2018-04-12 LAB — CBC
HCT: 28.1 % — ABNORMAL LOW (ref 36.0–46.0)
Hemoglobin: 8.5 g/dL — ABNORMAL LOW (ref 12.0–15.0)
MCH: 28.1 pg (ref 26.0–34.0)
MCHC: 30.2 g/dL (ref 30.0–36.0)
MCV: 93 fL (ref 80.0–100.0)
NRBC: 0.3 % — AB (ref 0.0–0.2)
Platelets: 291 10*3/uL (ref 150–400)
RBC: 3.02 MIL/uL — ABNORMAL LOW (ref 3.87–5.11)
RDW: 16.3 % — AB (ref 11.5–15.5)
WBC: 9.7 10*3/uL (ref 4.0–10.5)

## 2018-04-12 LAB — BASIC METABOLIC PANEL
Anion gap: 7 (ref 5–15)
BUN: 29 mg/dL — ABNORMAL HIGH (ref 8–23)
CALCIUM: 8.4 mg/dL — AB (ref 8.9–10.3)
CO2: 26 mmol/L (ref 22–32)
Chloride: 109 mmol/L (ref 98–111)
Creatinine, Ser: 0.73 mg/dL (ref 0.44–1.00)
GFR calc Af Amer: >60 mL/min (ref >60)
Glucose, Bld: 172 mg/dL — ABNORMAL HIGH (ref 70–99)
Potassium: 3.7 mmol/L (ref 3.5–5.1)
Sodium: 142 mmol/L (ref 135–145)

## 2018-04-12 LAB — GLUCOSE, CAPILLARY
GLUCOSE-CAPILLARY: 201 mg/dL — AB (ref 70–99)
GLUCOSE-CAPILLARY: 133 mg/dL — AB (ref 70–99)
Glucose-Capillary: 144 mg/dL — ABNORMAL HIGH (ref 70–99)
Glucose-Capillary: 144 mg/dL — ABNORMAL HIGH (ref 70–99)
Glucose-Capillary: 138 mg/dL — ABNORMAL HIGH (ref 70–99)
Glucose-Capillary: 177 mg/dL — ABNORMAL HIGH (ref 70–99)

## 2018-04-12 MED ORDER — SODIUM CHLORIDE 0.9 % IV SOLN
INTRAVENOUS | Status: DC | PRN
  Administered 2018-04-12 – 2018-04-24 (×5): 500 mL via INTRAVENOUS

## 2018-04-12 MED ORDER — FUROSEMIDE 10 MG/ML IJ SOLN
40.0000 mg | Freq: Two times a day (BID) | INTRAMUSCULAR | Status: AC
  Administered 2018-04-12 (×2): 40 mg via INTRAVENOUS
  Filled 2018-04-12 (×2): qty 4

## 2018-04-12 MED ORDER — CEFAZOLIN SODIUM-DEXTROSE 1-4 GM/50ML-% IV SOLN
1.0000 g | Freq: Three times a day (TID) | INTRAVENOUS | Status: AC
  Administered 2018-04-12 – 2018-04-17 (×15): 1 g via INTRAVENOUS
  Filled 2018-04-12 (×16): qty 50
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
## 2018-04-12 NOTE — Progress Notes (Signed)
Patient ID: Renee [name], female   DOB: 09-28-1945, 73 y.o.   MRN: [id number] CT reviewed. New hygroma on L, will watch with serial scans.

## 2018-04-13 LAB — GLUCOSE, CAPILLARY
GLUCOSE-CAPILLARY: 137 mg/dL — AB (ref 70–99)
Glucose-Capillary: 129 mg/dL — ABNORMAL HIGH (ref 70–99)
Glucose-Capillary: 145 mg/dL — ABNORMAL HIGH (ref 70–99)
Glucose-Capillary: 151 mg/dL — ABNORMAL HIGH (ref 70–99)
Glucose-Capillary: 162 mg/dL — ABNORMAL HIGH (ref 70–99)
Glucose-Capillary: 172 mg/dL — ABNORMAL HIGH (ref 70–99)

## 2018-04-13 LAB — BASIC METABOLIC PANEL
Anion gap: 10 (ref 5–15)
BUN: 36 mg/dL — ABNORMAL HIGH (ref 8–23)
CO2: 31 mmol/L (ref 22–32)
Calcium: 8.3 mg/dL — ABNORMAL LOW (ref 8.9–10.3)
Chloride: 98 mmol/L (ref 98–111)
Creatinine, Ser: 0.82 mg/dL (ref 0.44–1.00)
GFR calc Af Amer: >60 mL/min (ref >60)
GFR calc non Af Amer: >60 mL/min (ref >60)
Glucose, Bld: 166 mg/dL — ABNORMAL HIGH (ref 70–99)
POTASSIUM: 3.3 mmol/L — AB (ref 3.5–5.1)
Sodium: 139 mmol/L (ref 135–145)

## 2018-04-13 LAB — CBC
HCT: 27.2 % — ABNORMAL LOW (ref 36.0–46.0)
HEMOGLOBIN: 8.2 g/dL — AB (ref 12.0–15.0)
MCH: 28 pg (ref 26.0–34.0)
MCHC: 30.1 g/dL (ref 30.0–36.0)
MCV: 92.8 fL (ref 80.0–100.0)
NRBC: 0 % (ref 0.0–0.2)
Platelets: 315 10*3/uL (ref 150–400)
RBC: 2.93 MIL/uL — ABNORMAL LOW (ref 3.87–5.11)
RDW: 16.2 % — ABNORMAL HIGH (ref 11.5–15.5)
WBC: 8.9 10*3/uL (ref 4.0–10.5)
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
## 2018-04-14 LAB — CBC
HCT: 24.6 % — ABNORMAL LOW (ref 36.0–46.0)
HEMOGLOBIN: 7.2 g/dL — AB (ref 12.0–15.0)
MCH: 27.8 pg (ref 26.0–34.0)
MCHC: 29.3 g/dL — ABNORMAL LOW (ref 30.0–36.0)
MCV: 95 fL (ref 80.0–100.0)
Platelets: 310 10*3/uL (ref 150–400)
RBC: 2.59 MIL/uL — ABNORMAL LOW (ref 3.87–5.11)
RDW: 15.9 % — ABNORMAL HIGH (ref 11.5–15.5)
WBC: 7.3 10*3/uL (ref 4.0–10.5)
nRBC: 0 % (ref 0.0–0.2)

## 2018-04-14 LAB — GLUCOSE, CAPILLARY
GLUCOSE-CAPILLARY: 129 mg/dL — AB (ref 70–99)
GLUCOSE-CAPILLARY: 129 mg/dL — AB (ref 70–99)
Glucose-Capillary: 146 mg/dL — ABNORMAL HIGH (ref 70–99)
Glucose-Capillary: 133 mg/dL — ABNORMAL HIGH (ref 70–99)
Glucose-Capillary: 143 mg/dL — ABNORMAL HIGH (ref 70–99)
Glucose-Capillary: 176 mg/dL — ABNORMAL HIGH (ref 70–99)

## 2018-04-14 LAB — BASIC METABOLIC PANEL
Anion gap: 9 (ref 5–15)
BUN: 35 mg/dL — ABNORMAL HIGH (ref 8–23)
CO2: 31 mmol/L (ref 22–32)
Calcium: 8.1 mg/dL — ABNORMAL LOW (ref 8.9–10.3)
Chloride: 101 mmol/L (ref 98–111)
Creatinine, Ser: 0.77 mg/dL (ref 0.44–1.00)
GFR calc Af Amer: >60 mL/min (ref >60)
GFR calc non Af Amer: >60 mL/min (ref >60)
Glucose, Bld: 162 mg/dL — ABNORMAL HIGH (ref 70–99)
Potassium: 3 mmol/L — ABNORMAL LOW (ref 3.5–5.1)
Sodium: 141 mmol/L (ref 135–145)
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
## 2018-04-15 ENCOUNTER — Inpatient Hospital Stay (HOSPITAL_COMMUNITY): Payer: Medicare Other

## 2018-04-15 LAB — CBC
HCT: 25.2 % — ABNORMAL LOW (ref 36.0–46.0)
Hemoglobin: 7.7 g/dL — ABNORMAL LOW (ref 12.0–15.0)
MCH: 29.1 pg (ref 26.0–34.0)
MCHC: 30.6 g/dL (ref 30.0–36.0)
MCV: 95.1 fL (ref 80.0–100.0)
Platelets: 344 10*3/uL (ref 150–400)
RBC: 2.65 MIL/uL — ABNORMAL LOW (ref 3.87–5.11)
RDW: 15.9 % — ABNORMAL HIGH (ref 11.5–15.5)
WBC: 9.5 10*3/uL (ref 4.0–10.5)
nRBC: 0 % (ref 0.0–0.2)

## 2018-04-15 LAB — GLUCOSE, CAPILLARY
Glucose-Capillary: 110 mg/dL — ABNORMAL HIGH (ref 70–99)
Glucose-Capillary: 145 mg/dL — ABNORMAL HIGH (ref 70–99)
Glucose-Capillary: 177 mg/dL — ABNORMAL HIGH (ref 70–99)
Glucose-Capillary: 153 mg/dL — ABNORMAL HIGH (ref 70–99)
Glucose-Capillary: 118 mg/dL — ABNORMAL HIGH (ref 70–99)
Glucose-Capillary: 208 mg/dL — ABNORMAL HIGH (ref 70–99)

## 2018-04-15 LAB — BASIC METABOLIC PANEL
ANION GAP: 11 (ref 5–15)
BUN: 35 mg/dL — ABNORMAL HIGH (ref 8–23)
CALCIUM: 8 mg/dL — AB (ref 8.9–10.3)
CO2: 28 mmol/L (ref 22–32)
Chloride: 99 mmol/L (ref 98–111)
Creatinine, Ser: 0.72 mg/dL (ref 0.44–1.00)
GFR calc Af Amer: >60 mL/min (ref >60)
GFR calc non Af Amer: >60 mL/min (ref >60)
Glucose, Bld: 164 mg/dL — ABNORMAL HIGH (ref 70–99)
Potassium: 3.5 mmol/L (ref 3.5–5.1)
Sodium: 138 mmol/L (ref 135–145)

## 2018-04-15 MED ORDER — FUROSEMIDE 10 MG/ML IJ SOLN
20.0000 mg | Freq: Once | INTRAMUSCULAR | Status: AC
  Administered 2018-04-15: 20 mg via INTRAVENOUS
  Filled 2018-04-15: qty 2

## 2018-04-15 MED ORDER — POTASSIUM CHLORIDE 20 MEQ/15ML (10%) PO SOLN
40.0000 meq | Freq: Two times a day (BID) | ORAL | Status: AC
  Administered 2018-04-15 (×2): 40 meq via ORAL
  Filled 2018-04-15 (×2): qty 30

## 2018-04-15 NOTE — Progress Notes (Signed)
Pt placed back on full support for the night to rest.  RT will continue to monitor.  

## 2018-04-15 NOTE — Progress Notes (Signed)
Patient ID: Loni BeckwithLena Kelly Im, female   DOB: 09/18/1945, 73 y.o.   MRN: [id number] Follow up - Trauma Critical Care  Patient Details:    Loni BeckwithLena Kelly Page is an 73 y.o. female.  Lines/tubes : Airway 7.5 mm (Active)  Secured at (cm) 24 cm 04/15/2018  7:32 AM  Measured From Lips 04/15/2018  7:32 AM  Secured Location Center 04/15/2018  7:32 AM  Secured By [employer] Tube Holder 04/15/2018  7:32 AM  Tube Holder Repositioned Yes 04/15/2018  7:32 AM  Cuff Pressure (cm H2O) 25 cm H2O 04/15/2018  7:32 AM  Site Condition Dry 04/15/2018  7:32 AM     PICC Double Lumen 04/04/18 PICC Right Basilic 48 cm 3 cm (Active)  Indication for Insertion or Continuance of Line Limited venous access - need for IV therapy >5 days (PICC only) 04/14/2018  8:00 PM  Exposed Catheter (cm) 3 cm 04/10/2018  8:00 AM  Site Assessment Clean;Dry;Intact 04/14/2018  8:00 PM  Lumen #1 Status Infusing 04/14/2018  8:00 PM  Lumen #2 Status Infusing;In-line blood sampling system in place 04/14/2018  8:00 PM  Dressing Type Transparent 04/14/2018  8:00 PM  Dressing Status Clean;Dry;Intact;Antimicrobial disc in place 04/14/2018  8:00 PM  Line Care Connections checked and tightened 04/14/2018  8:00 PM  Dressing Intervention Dressing changed;Antimicrobial disc changed;Securement device changed 04/10/2018  5:00 PM  Dressing Change Due 04/14/18 04/14/2018  8:00 PM     NG/OG Tube Orogastric Center mouth Xray (Active)  External Length of Tube (cm) - (if applicable) 62 cm 04/11/2018  8:00 PM  Site Assessment Clean;Dry;Intact 04/14/2018  8:00 PM  Ongoing Placement Verification No acute changes, not attributed to clinical condition;No change in respiratory status;No change in cm markings or external length of tube from initial placement 04/14/2018  8:00 PM  Status Infusing tube feed 04/14/2018  8:00 PM  Intake (mL) 100 mL 04/12/2018  6:00 PM     External Urinary Catheter (Active)  Collection Container Dedicated Suction Canister 04/14/2018  8:00 PM  Securement  Method Securing device (Describe) 04/14/2018  8:00 AM  Intervention Equipment Changed 04/15/2018  1:00 AM  Output (mL) 600 mL 04/15/2018  1:00 AM    Microbiology/Sepsis markers: Results for orders placed or performed during the hospital encounter of 2019/03/15  Urine culture     Status: None   Collection Time: 04/04/18  1:42 AM  Result Value Ref Range Status   Specimen Description URINE, CATHETERIZED  Final   Special Requests NONE  Final   Culture   Final    NO GROWTH Performed at [facility], [address] [place], [place] [address]    Report Status 04/18/2018 FINAL  Final  MRSA PCR Screening     Status: Abnormal   Collection Time: 04/04/18  3:18 AM  Result Value Ref Range Status   MRSA by PCR POSITIVE (A) NEGATIVE Final    Comment:        The GeneXpert MRSA Assay (FDA approved for NASAL specimens only), is one component of a comprehensive MRSA colonization surveillance program. It is not intended to diagnose MRSA infection nor to guide or monitor treatment for MRSA infections. RESULT CALLED TO, READ BACK BY AND VERIFIED WITH: Rene KocherM PLUMMER RN [phone] 04/04/18 [name] Performed at [facility], [address] [place], [place] [address]   Culture, respiratory (non-expectorated)     Status: None   Collection Time: 04/09/18  7:44 AM  Result Value Ref Range Status   Specimen Description TRACHEAL ASPIRATE  Final   Special Requests Normal  Final   Gram Stain   Final    MODERATE WBC PRESENT, PREDOMINANTLY PMN FEW GRAM NEGATIVE RODS RARE GRAM POSITIVE COCCI IN CLUSTERS Performed at [facility], [address], [place], [place] [address]    Culture FEW ESCHERICHIA COLI  Final   Report Status 04/11/2018 FINAL  Final   Organism ID, Bacteria ESCHERICHIA COLI  Final      Susceptibility   Escherichia coli - MIC*    AMPICILLIN 8 SENSITIVE Sensitive     CEFAZOLIN <=4 SENSITIVE Sensitive     CEFEPIME <=1 SENSITIVE Sensitive     CEFTAZIDIME <=1 SENSITIVE Sensitive      CEFTRIAXONE <=1 SENSITIVE Sensitive     CIPROFLOXACIN <=0.25 SENSITIVE Sensitive     GENTAMICIN <=1 SENSITIVE Sensitive     IMIPENEM <=0.25 SENSITIVE Sensitive     TRIMETH/SULFA <=20 SENSITIVE Sensitive     AMPICILLIN/SULBACTAM 4 SENSITIVE Sensitive     PIP/TAZO <=4 SENSITIVE Sensitive     Extended ESBL NEGATIVE Sensitive     * FEW ESCHERICHIA COLI    Anti-infectives:  Anti-infectives (From admission, onward)   Start     Dose/Rate Route Frequency Ordered Stop   04/12/18 1800  ceFAZolin (ANCEF) IVPB 1 g/50 mL premix     1 g 100 mL/hr over 30 Minutes Intravenous Every 8 hours 04/12/18 1300 04/18/18 0159   04/11/18 1000  ceFEPIme (MAXIPIME) 1 g in sodium chloride 0.9 % 100 mL IVPB  Status:  Discontinued     1 g 200 mL/hr over 30 Minutes Intravenous Every 12 hours 04/11/18 0732 04/12/18 1300   03/27/2018 1800  ceFAZolin (ANCEF) IVPB 2g/100 mL premix     2 g 200 mL/hr over 30 Minutes Intravenous Every 8 hours 04/17/2018 1609 04/06/18 1520   04/06/2018 1258  vancomycin (VANCOCIN) powder  Status:  Discontinued       As needed 04/09/2018 1258 04/04/2018 1534   03/20/2018 1000  vancomycin (VANCOCIN) IVPB 1000 mg/200 mL premix     1,000 mg 200 mL/hr over 60 Minutes Intravenous To ShortStay Surgical 04/04/18 1257 04/04/2018 1242   03/30/2018 0930  ceFAZolin (ANCEF) IVPB 2g/100 mL premix  Status:  Discontinued     2 g 200 mL/hr over 30 Minutes Intravenous To ShortStay Surgical 04/17/2018 0712 04/17/2018 1540   04/04/18 0930  ceFAZolin (ANCEF) IVPB 1 g/50 mL premix  Status:  Discontinued     1 g 100 mL/hr over 30 Minutes Intravenous Every 8 hours 04/04/18 0840 03/22/2018 1609      Best Practice/Protocols:  VTE Prophylaxis: Lovenox (prophylaxtic dose) Continous Sedation  Consults: Treatment Team:  Tia Alert, MD Roby Lofts, MD    Studies:    Events:  Subjective:    Overnight Issues:   Objective:  Vital signs for last 24 hours: Temp:  [99.4 F (37.4 C)-100 F (37.8 C)] 99.4 F  (37.4 C) (01/27 0400) Pulse Rate:  [79-100] 83 (01/27 0700) Resp:  [12-20] 14 (01/27 0700) BP: (121-154)/(50-89) 136/57 (01/27 0700) SpO2:  [99 %-100 %] 100 % (01/27 0700) FiO2 (%):  [30 %] 30 % (01/27 0812)  Hemodynamic parameters for last 24 hours:    Intake/Output from previous day: 01/26 0701 - 01/27 0700 In: 2489.3 [I.V.:749.3; NG/GT:1590; IV Piggyback:150] Out: 1400 [Urine:1400]  Intake/Output this shift: No intake/output data recorded.  Vent settings for last 24 hours: Vent Mode: CPAP;PSV FiO2 (%):  [30 %] 30 % Set Rate:  [15 bmp] 15 bmp Vt  Set:  [480 mL] 480 mL PEEP:  [5 cmH20] 5 cmH20 Pressure Support:  [8 cmH20-10 cmH20] 10 cmH20 Plateau Pressure:  [13 cmH20-17 cmH20] 16 cmH20  Physical Exam:  General: on vent Neuro: pupils 69mm, occ F/C to move toes HEENT/Neck: collar and ett Resp: clear to auscultation bilaterally CVS: RRR GI: soft, NT Extremities: no edema, no erythema, pulses WNL and correction mild edema, splints LUE, BLE  Results for orders placed or performed during the hospital encounter of April 05, 2018 (from the past 24 hour(s))  Glucose, capillary     Status: Abnormal   Collection Time: 04/14/18 11:13 AM  Result Value Ref Range   Glucose-Capillary 129 (H) 70 - 99 mg/dL   Comment 1 Notify RN    Comment 2 Document in Chart   Glucose, capillary     Status: Abnormal   Collection Time: 04/14/18  3:26 PM  Result Value Ref Range   Glucose-Capillary 143 (H) 70 - 99 mg/dL   Comment 1 Notify RN    Comment 2 Document in Chart   Glucose, capillary     Status: Abnormal   Collection Time: 04/14/18  7:14 PM  Result Value Ref Range   Glucose-Capillary 129 (H) 70 - 99 mg/dL  Glucose, capillary     Status: Abnormal   Collection Time: 04/14/18 11:14 PM  Result Value Ref Range   Glucose-Capillary 176 (H) 70 - 99 mg/dL  Glucose, capillary     Status: Abnormal   Collection Time: 04/15/18  3:37 AM  Result Value Ref Range   Glucose-Capillary 110 (H) 70 - 99 mg/dL   CBC     Status: Abnormal   Collection Time: 04/15/18  5:23 AM  Result Value Ref Range   WBC 9.5 4.0 - 10.5 K/uL   RBC 2.65 (L) 3.87 - 5.11 MIL/uL   Hemoglobin 7.7 (L) 12.0 - 15.0 g/dL   HCT 01.0 (L) 93.2 - 35.5 %   MCV 95.1 80.0 - 100.0 fL   MCH 29.1 26.0 - 34.0 pg   MCHC 30.6 30.0 - 36.0 g/dL   RDW 73.2 (H) 20.2 - 54.2 %   Platelets 344 150 - 400 K/uL   nRBC 0.0 0.0 - 0.2 %  Basic metabolic panel     Status: Abnormal   Collection Time: 04/15/18  5:23 AM  Result Value Ref Range   Sodium 138 135 - 145 mmol/L   Potassium 3.5 3.5 - 5.1 mmol/L   Chloride 99 98 - 111 mmol/L   CO2 28 22 - 32 mmol/L   Glucose, Bld 164 (H) 70 - 99 mg/dL   BUN 35 (H) 8 - 23 mg/dL   Creatinine, Ser 7.06 0.44 - 1.00 mg/dL   Calcium 8.0 (L) 8.9 - 10.3 mg/dL   GFR calc non Af Amer >60 >60 mL/min   GFR calc Af Amer >60 >60 mL/min   Anion gap 11 5 - 15  Glucose, capillary     Status: Abnormal   Collection Time: 04/15/18  7:42 AM  Result Value Ref Range   Glucose-Capillary 145 (H) 70 - 99 mg/dL   Comment 1 Notify RN    Comment 2 Document in Chart     Assessment & Plan: Present on Admission: **None**    LOS: 11 days   Additional comments:I reviewed the patient's new clinical lab test results. Marland Kitchen PHBC TBI/SAH- per Dr. Yetta Barre, F/U CT head 1/17with some increase in L FICC and R SAH. CT head hygroma  Agree with Dr. Yetta Barre aggressive care may  not be indicated C1-2 FXs- collar per Dr. Yetta Barre B rib FX (R 2-9; L2, 4-7) with small R PTX Acute hypoxic ventilator dependent respiratory failure- failed extubation 1/20. con't to wean as able. Likely need for trach- see below. Guaifenesin for secretions. HTN- Lopressor Acute urinary retention- seems improved ID- Ancef for e coli PNA to stop 1/30 ABL anemia L ulna and 4th finger FX -ORIF L ulna 1/17 Dr. Jena Gauss  R scapula FX- per Dr. Jena Gauss B tibial plateau FX- ORIF L side 1/17 Dr. Jena Gauss  R fibula FX- per Dr. Jena Gauss R bimal ankle FX- ORIF 1/17 Dr.  Jena Gauss Hyperglycemia- SSI HX schizophrenia per family- has not been on meds since last summer VTE- plexipulse,Lovenox FEN-TF, lasix X 1 today Dispo- ICU, prognosis for further recovery guarded. I will call her sister today to again discuss possible trach/PEG Critical Care Total Time*: 58 Minutes  Violeta Gelinas, MD, MPH, FACS Trauma: [phone] General Surgery: [phone]  04/15/2018  *Care during the described time interval was provided by me. I have reviewed this patient's available data, including medical history, events of note, physical examination and test results as part of my evaluation.

## 2018-04-15 NOTE — Progress Notes (Signed)
Patient ID: Renee [name], female   DOB: [birth year], 73 y.o.   MRN: [id number] I called her sister, Deri Fuelling, and reviewed her condition. I discussed possibly doing trach/PEG this week. I also discussed Alsie would likely have to go to a SNF if her mental status does not improve. She would like to speak with the other family members before deciding if they want to proceed. I will speak with her again 1/29.  Violeta Gelinas, MD, MPH, FACS Trauma: [phone] General Surgery: [phone]

## 2018-04-16 LAB — GLUCOSE, CAPILLARY
GLUCOSE-CAPILLARY: 179 mg/dL — AB (ref 70–99)
Glucose-Capillary: 122 mg/dL — ABNORMAL HIGH (ref 70–99)
Glucose-Capillary: 138 mg/dL — ABNORMAL HIGH (ref 70–99)
Glucose-Capillary: 153 mg/dL — ABNORMAL HIGH (ref 70–99)
Glucose-Capillary: 127 mg/dL — ABNORMAL HIGH (ref 70–99)
Glucose-Capillary: 170 mg/dL — ABNORMAL HIGH (ref 70–99)

## 2018-04-16 LAB — BASIC METABOLIC PANEL
Anion gap: 11 (ref 5–15)
BUN: 32 mg/dL — ABNORMAL HIGH (ref 8–23)
CO2: 30 mmol/L (ref 22–32)
Calcium: 8.1 mg/dL — ABNORMAL LOW (ref 8.9–10.3)
Chloride: 98 mmol/L (ref 98–111)
Creatinine, Ser: 0.69 mg/dL (ref 0.44–1.00)
GFR calc non Af Amer: >60 mL/min (ref >60)
Glucose, Bld: 171 mg/dL — ABNORMAL HIGH (ref 70–99)
POTASSIUM: 3.7 mmol/L (ref 3.5–5.1)
Sodium: 139 mmol/L (ref 135–145)

## 2018-04-16 LAB — CBC
HCT: 24.9 % — ABNORMAL LOW (ref 36.0–46.0)
Hemoglobin: 7.4 g/dL — ABNORMAL LOW (ref 12.0–15.0)
MCH: 28.6 pg (ref 26.0–34.0)
MCHC: 29.7 g/dL — ABNORMAL LOW (ref 30.0–36.0)
MCV: 96.1 fL (ref 80.0–100.0)
NRBC: 0 % (ref 0.0–0.2)
Platelets: 371 10*3/uL (ref 150–400)
RBC: 2.59 MIL/uL — AB (ref 3.87–5.11)
RDW: 15.9 % — ABNORMAL HIGH (ref 11.5–15.5)
WBC: 6.6 10*3/uL (ref 4.0–10.5)

## 2018-04-16 MED ORDER — FUROSEMIDE 10 MG/ML IJ SOLN
40.0000 mg | Freq: Two times a day (BID) | INTRAMUSCULAR | Status: AC
  Administered 2018-04-16 (×2): 40 mg via INTRAVENOUS
  Filled 2018-04-16 (×2): qty 4

## 2018-04-16 NOTE — Progress Notes (Signed)
ETT tube holder changed. Patient tolerated well. RT will continue to monitor.

## 2018-04-16 NOTE — Progress Notes (Signed)
Follow up - Trauma and Critical Care  Patient Details:    Renee [name] is an 73 y.o. female.  Lines/tubes : Airway 7.5 mm (Active)  Secured at (cm) 23 cm 04/16/2018  3:06 AM  Measured From Lips 04/16/2018  3:06 AM  Secured Location Right 04/16/2018  3:06 AM  Secured By [employer] Tube Holder 04/16/2018  3:06 AM  Tube Holder Repositioned Yes 04/16/2018  3:06 AM  Cuff Pressure (cm H2O) 28 cm H2O 04/15/2018  7:55 PM  Site Condition Dry 04/16/2018 12:00 AM     PICC Double Lumen 04/04/18 PICC Right Basilic 48 cm 3 cm (Active)  Indication for Insertion or Continuance of Line Limited venous access - need for IV therapy >5 days (PICC only) 04/15/2018  8:00 PM  Exposed Catheter (cm) 3 cm 04/10/2018  8:00 AM  Site Assessment Clean;Dry;Intact 04/15/2018  8:00 PM  Lumen #1 Status Infusing 04/15/2018  8:00 PM  Lumen #2 Status Infusing;In-line blood sampling system in place 04/15/2018  8:00 PM  Dressing Type Transparent;Occlusive 04/15/2018  8:00 PM  Dressing Status Clean;Dry;Intact;Antimicrobial disc in place 04/15/2018  8:00 PM  Line Care Connections checked and tightened 04/15/2018  8:00 PM  Dressing Intervention Dressing changed;Antimicrobial disc changed;Securement device changed 04/10/2018  5:00 PM  Dressing Change Due 04/17/18 04/15/2018  8:00 PM     NG/OG Tube Orogastric Center mouth Xray (Active)  External Length of Tube (cm) - (if applicable) 62 cm 04/11/2018  8:00 PM  Site Assessment Clean;Dry 04/15/2018  8:00 PM  Ongoing Placement Verification No change in cm markings or external length of tube from initial placement;No change in respiratory status;No acute changes, not attributed to clinical condition 04/15/2018  8:00 PM  Status Infusing tube feed 04/15/2018  8:00 PM  Intake (mL) 100 mL 04/12/2018  6:00 PM     External Urinary Catheter (Active)  Collection Container Dedicated Suction Canister 04/15/2018  8:00 PM  Securement Method Securing device (Describe) 04/14/2018  8:00 AM  Intervention  Equipment Changed 04/16/2018  2:40 AM  Output (mL) 70 mL 04/16/2018  6:00 AM    Microbiology/Sepsis markers: Results for orders placed or performed during the hospital encounter of 03/29/2018  Urine culture     Status: None   Collection Time: 04/04/18  1:42 AM  Result Value Ref Range Status   Specimen Description URINE, CATHETERIZED  Final   Special Requests NONE  Final   Culture   Final    NO GROWTH Performed at [facility], [address] [place], [place] [address]    Report Status 05-30-18 FINAL  Final  MRSA PCR Screening     Status: Abnormal   Collection Time: 04/04/18  3:18 AM  Result Value Ref Range Status   MRSA by PCR POSITIVE (Renee) NEGATIVE Final    Comment:        The GeneXpert MRSA Assay (FDA approved for NASAL specimens only), is one component of Renee comprehensive MRSA colonization surveillance program. It is not intended to diagnose MRSA infection nor to guide or monitor treatment for MRSA infections. RESULT CALLED TO, READ BACK BY AND VERIFIED WITH: Renee KocherM PLUMMER RN [phone] 04/04/18 Renee [name] Performed at [facility], [address] [place], [place] [address]   Culture, respiratory (non-expectorated)     Status: None   Collection Time: 04/09/18  7:44 AM  Result Value Ref Range Status   Specimen Description TRACHEAL ASPIRATE  Final   Special Requests Normal  Final   Gram Stain   Final  MODERATE WBC PRESENT, PREDOMINANTLY PMN FEW GRAM NEGATIVE RODS RARE GRAM POSITIVE COCCI IN CLUSTERS Performed at [facility], [address] [place], [place] [address]    Culture FEW ESCHERICHIA COLI  Final   Report Status 04/11/2018 FINAL  Final   Organism ID, Bacteria ESCHERICHIA COLI  Final      Susceptibility   Escherichia coli - MIC*    AMPICILLIN 8 SENSITIVE Sensitive     CEFAZOLIN <=4 SENSITIVE Sensitive     CEFEPIME <=1 SENSITIVE Sensitive     CEFTAZIDIME <=1 SENSITIVE Sensitive     CEFTRIAXONE <=1 SENSITIVE Sensitive     CIPROFLOXACIN <=0.25  SENSITIVE Sensitive     GENTAMICIN <=1 SENSITIVE Sensitive     IMIPENEM <=0.25 SENSITIVE Sensitive     TRIMETH/SULFA <=20 SENSITIVE Sensitive     AMPICILLIN/SULBACTAM 4 SENSITIVE Sensitive     PIP/TAZO <=4 SENSITIVE Sensitive     Extended ESBL NEGATIVE Sensitive     * FEW ESCHERICHIA COLI    Anti-infectives:  Anti-infectives (From admission, onward)   Start     Dose/Rate Route Frequency Ordered Stop   04/12/18 1800  ceFAZolin (ANCEF) IVPB 1 g/50 mL premix     1 g 100 mL/hr over 30 Minutes Intravenous Every 8 hours 04/12/18 1300 04/18/18 0159   04/11/18 1000  ceFEPIme (MAXIPIME) 1 g in sodium chloride 0.9 % 100 mL IVPB  Status:  Discontinued     1 g 200 mL/hr over 30 Minutes Intravenous Every 12 hours 04/11/18 0732 04/12/18 1300   12/04/18 1800  ceFAZolin (ANCEF) IVPB 2g/100 mL premix     2 g 200 mL/hr over 30 Minutes Intravenous Every 8 hours 12/04/18 1609 04/06/18 1520   12/04/18 1258  vancomycin (VANCOCIN) powder  Status:  Discontinued       As needed 12/04/18 1258 12/04/18 1534   12/04/18 1000  vancomycin (VANCOCIN) IVPB 1000 mg/200 mL premix     1,000 mg 200 mL/hr over 60 Minutes Intravenous To ShortStay Surgical 04/04/18 1257 12/04/18 1242   12/04/18 0930  ceFAZolin (ANCEF) IVPB 2g/100 mL premix  Status:  Discontinued     2 g 200 mL/hr over 30 Minutes Intravenous To ShortStay Surgical 12/04/18 0712 12/04/18 1540   04/04/18 0930  ceFAZolin (ANCEF) IVPB 1 g/50 mL premix  Status:  Discontinued     1 g 100 mL/hr over 30 Minutes Intravenous Every 8 hours 04/04/18 0840 12/04/18 1609      Best Practice/Protocols:  VTE Prophylaxis: Lovenox (prophylaxtic dose) Continous Sedation  Consults: Treatment Team:  Tia AlertJones, David S, MD Roby LoftsHaddix, Kevin P, MD    Events:  Chief Complaint/Subjective:    Overnight Issues: No acute events  Objective:  Vital signs for last 24 hours: Temp:  [97.7 F (36.5 C)-101 F (38.3 C)] 98.8 F (37.1 C) (01/28 0400) Pulse Rate:  [71-111] 76  (01/28 0700) Resp:  [12-19] 15 (01/28 0700) BP: (123-172)/(50-86) 153/62 (01/28 0700) SpO2:  [99 %-100 %] 100 % (01/28 0700) FiO2 (%):  [30 %] 30 % (01/28 0700)  Hemodynamic parameters for last 24 hours:    Intake/Output from previous day: 01/27 0701 - 01/28 0700 In: 1729.9 [I.V.:544.9; NG/GT:1035; IV Piggyback:150.1] Out: 2135 [Urine:2135]  Intake/Output this shift: No intake/output data recorded.  Vent settings for last 24 hours: Vent Mode: PRVC FiO2 (%):  [30 %] 30 % Set Rate:  [15 bmp] 15 bmp Vt Set:  [480 mL] 480 mL PEEP:  [5 cmH20] 5 cmH20 Pressure Support:  [10 cmH20] 10 cmH20  Plateau Pressure:  [14 cmH20-16 cmH20] 15 cmH20  Physical Exam:  Gen: intubated, somnolent HEENT: ETT in place Resp: assisted, CTAB Cardiovascular: RRR Abdomen: soft, NT, ND Ext: trace edema Neuro: GCS 10t  Results for orders placed or performed during the hospital encounter of May 01, 2018 (from the past 24 hour(s))  Glucose, capillary     Status: Abnormal   Collection Time: 04/15/18 11:09 AM  Result Value Ref Range   Glucose-Capillary 177 (H) 70 - 99 mg/dL   Comment 1 Notify RN    Comment 2 Document in Chart   Glucose, capillary     Status: Abnormal   Collection Time: 04/15/18  3:31 PM  Result Value Ref Range   Glucose-Capillary 153 (H) 70 - 99 mg/dL  Glucose, capillary     Status: Abnormal   Collection Time: 04/15/18  7:46 PM  Result Value Ref Range   Glucose-Capillary 118 (H) 70 - 99 mg/dL  Glucose, capillary     Status: Abnormal   Collection Time: 04/15/18 11:12 PM  Result Value Ref Range   Glucose-Capillary 208 (H) 70 - 99 mg/dL  Glucose, capillary     Status: Abnormal   Collection Time: 04/16/18  3:24 AM  Result Value Ref Range   Glucose-Capillary 122 (H) 70 - 99 mg/dL  CBC     Status: Abnormal   Collection Time: 04/16/18  5:00 AM  Result Value Ref Range   WBC 6.6 4.0 - 10.5 K/uL   RBC 2.59 (L) 3.87 - 5.11 MIL/uL   Hemoglobin 7.4 (L) 12.0 - 15.0 g/dL   HCT 16.1 (L) 09.6 -  46.0 %   MCV 96.1 80.0 - 100.0 fL   MCH 28.6 26.0 - 34.0 pg   MCHC 29.7 (L) 30.0 - 36.0 g/dL   RDW 04.5 (H) 40.9 - 81.1 %   Platelets 371 150 - 400 K/uL   nRBC 0.0 0.0 - 0.2 %  Basic metabolic panel     Status: Abnormal   Collection Time: 04/16/18  5:00 AM  Result Value Ref Range   Sodium 139 135 - 145 mmol/L   Potassium 3.7 3.5 - 5.1 mmol/L   Chloride 98 98 - 111 mmol/L   CO2 30 22 - 32 mmol/L   Glucose, Bld 171 (H) 70 - 99 mg/dL   BUN 32 (H) 8 - 23 mg/dL   Creatinine, Ser 9.14 0.44 - 1.00 mg/dL   Calcium 8.1 (L) 8.9 - 10.3 mg/dL   GFR calc non Af Amer >60 >60 mL/min   GFR calc Af Amer >60 >60 mL/min   Anion gap 11 5 - 15     Assessment/Plan:   PHBC TBI/SAH- per Dr. Yetta Barre, F/U CT head 1/17with some increase in L FICC and R SAH. CT head hygromaAgree with Dr. Yetta Barre aggressive care may not be indicated.  Will recontact her sister today to determine further plans. C1-2 FXs- collar per Dr. Yetta Barre B rib FX (R 2-9; L2, 4-7) with small R PTX Acute hypoxic ventilator dependent respiratory failure- failed extubation 1/20. con't to wean as able. Likely need for trach. Guaifenesin for secretions. HTN- Lopressor Acute urinary retention- on urecholine, will add flomax, may need foley if continues to have retention. ID- Ancef fore coli PNAto stop 1/30 ABL anemia L ulna and 4th finger FX -ORIF L ulna 1/17 Dr. Jena Gauss  R scapula FX- per Dr. Jena Gauss B tibial plateau FX- ORIF L side 1/17 Dr. Jena Gauss  R fibula FX- per Dr. Jena Gauss R bimal ankle FX- ORIF 1/17 Dr.  Haddix Hyperglycemia- SSI HX schizophrenia per family- has not been on meds since last summer VTE- plexipulse,Lovenox FEN-TF, lasix X 1 today Dispo- ICU, prognosis for further recovery guarded.   LOS: 12 days   Additional comments:None  Critical Care Total Time*:  [place] Deniese Oberry 04/16/2018  *Care during the described time interval was provided by me and/or other providers on the critical care  team.  I have reviewed this patient's available data, including medical history, events of note, physical examination and test results as part of my evaluation.

## 2018-04-17 ENCOUNTER — Inpatient Hospital Stay: Payer: Self-pay

## 2018-04-17 ENCOUNTER — Inpatient Hospital Stay (HOSPITAL_COMMUNITY): Payer: Medicare Other

## 2018-04-17 LAB — GLUCOSE, CAPILLARY
GLUCOSE-CAPILLARY: 164 mg/dL — AB (ref 70–99)
Glucose-Capillary: 126 mg/dL — ABNORMAL HIGH (ref 70–99)
Glucose-Capillary: 155 mg/dL — ABNORMAL HIGH (ref 70–99)
Glucose-Capillary: 156 mg/dL — ABNORMAL HIGH (ref 70–99)
Glucose-Capillary: 163 mg/dL — ABNORMAL HIGH (ref 70–99)
Glucose-Capillary: 189 mg/dL — ABNORMAL HIGH (ref 70–99)

## 2018-04-17 NOTE — Progress Notes (Signed)
Nutrition Follow-up  DOCUMENTATION CODES:   Not applicable  INTERVENTION:   Continue Pivot 1.5 @ 45 ml/hr via OG tube 30 ml Prostat BID  Provides: 1820 kcal, 131 grams protein, and 819 ml free water Total free water: 1419 ml    NUTRITION DIAGNOSIS:   Increased nutrient needs related to (trauma) as evidenced by estimated needs. Ongoing.   GOAL:   Patient will meet greater than or equal to 90% of their needs Met.   MONITOR:   Vent status, I & O's  ASSESSMENT:   Pt with PMH of schizophrenia admitted as a PHBC with TBI/SAH, C1-2 fx (in collar), B rib fxs with small R PTX, L ulna and 4th finger fx, R scapula fx, B tibial plateau fx, R fibula fx, and R bimal ankle fx.   Pt discussed during ICU rounds and with RN.  Per MD notes plan to speak with family for goals of care.   1/17 to OR for repair of L ulna, 4th finger, B tibial pleateau fx, R bimal ankle fx 1/20 failed extubation   Patient is currently intubated on ventilator support MV: 6.6 L/min Temp (24hrs), Avg:99.5 F (37.5 C), Min:99.2 F (37.3 C), Max:100 F (37.8 C)  Medications reviewed and include: SSI, miralax  Labs reviewed: CBG: 127-189 Free water 200 ml every 8 hours - 600 ml  BP: 132/62 MAP: 81   I/O: +11046 ml since admit UOP: 4598 ml x 24 hrs   Diet Order:   Diet Order    None      EDUCATION NEEDS:   No education needs have been identified at this time  Skin:  Skin Assessment: Reviewed RN Assessment  Last BM:  1/29 x 3  Height:   Ht Readings from Last 1 Encounters:  04/12/2018 _0  (1.778 m)    Weight:   Wt Readings from Last 1 Encounters:  04/14/18 87.5 kg    Ideal Body Weight:  68.1 kg  BMI:  Body mass index is 27.68 kg/m.  Estimated Nutritional Needs:   Kcal:  3343  Protein:  122-145 grams  Fluid:  > 1.9 L/day  Maylon Peppers RD, LDN, CNSC [phone] Pager [phone] After Hours Pager

## 2018-04-17 NOTE — Progress Notes (Signed)
Patient ID: Renee [name], female   DOB: 12/15/1945, 73 y.o.   MRN: [id number] I spoke with her sister Deri Fuelling again regarding goals of care. We discussed extubation with plan to not reintubate vs trach again. She is leaning toward extubation. She is going to come up and see her and further discuss with her family. Of note, the patient's son is incarcerated in [place], [place]. If we plan extubation, CSW may be able to allow him to visit.  Violeta Gelinas, MD, MPH, FACS Trauma: [phone] General Surgery: [phone]

## 2018-04-17 NOTE — Plan of Care (Signed)
Pt currently on tube feeds.  Once extubated a speech evaluation will need to completed to determine appropriate diet. Will continue to monitor. Jaclyn Shaggy RN
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
## 2018-04-17 NOTE — Progress Notes (Signed)
Patient's PICC exchanged due to leaking at site with flush and infusion.  Removed PICC examined after procedure complete and was found to have a cut just past the zero mark.  Gasper Lloyd, RN VAST

## 2018-04-18 DIAGNOSIS — L8990 Pressure ulcer of unspecified site, unspecified stage: Secondary | ICD-10-CM

## 2018-04-18 LAB — GLUCOSE, CAPILLARY
GLUCOSE-CAPILLARY: 188 mg/dL — AB (ref 70–99)
Glucose-Capillary: 142 mg/dL — ABNORMAL HIGH (ref 70–99)
Glucose-Capillary: 113 mg/dL — ABNORMAL HIGH (ref 70–99)
Glucose-Capillary: 156 mg/dL — ABNORMAL HIGH (ref 70–99)
Glucose-Capillary: 127 mg/dL — ABNORMAL HIGH (ref 70–99)
Glucose-Capillary: 180 mg/dL — ABNORMAL HIGH (ref 70–99)

## 2018-04-18 LAB — BASIC METABOLIC PANEL
Anion gap: 8 (ref 5–15)
BUN: 33 mg/dL — AB (ref 8–23)
CO2: 32 mmol/L (ref 22–32)
Calcium: 8.4 mg/dL — ABNORMAL LOW (ref 8.9–10.3)
Chloride: 100 mmol/L (ref 98–111)
Creatinine, Ser: 0.69 mg/dL (ref 0.44–1.00)
GFR calc Af Amer: >60 mL/min (ref >60)
GFR calc non Af Amer: >60 mL/min (ref >60)
Glucose, Bld: 149 mg/dL — ABNORMAL HIGH (ref 70–99)
POTASSIUM: 3.6 mmol/L (ref 3.5–5.1)
Sodium: 140 mmol/L (ref 135–145)

## 2018-04-18 LAB — CBC
HCT: 26.5 % — ABNORMAL LOW (ref 36.0–46.0)
Hemoglobin: 7.9 g/dL — ABNORMAL LOW (ref 12.0–15.0)
MCH: 28.1 pg (ref 26.0–34.0)
MCHC: 29.8 g/dL — ABNORMAL LOW (ref 30.0–36.0)
MCV: 94.3 fL (ref 80.0–100.0)
Platelets: 386 10*3/uL (ref 150–400)
RBC: 2.81 MIL/uL — ABNORMAL LOW (ref 3.87–5.11)
RDW: 15.6 % — ABNORMAL HIGH (ref 11.5–15.5)
WBC: 5.8 10*3/uL (ref 4.0–10.5)
nRBC: 0 % (ref 0.0–0.2)
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
## 2018-04-18 NOTE — Progress Notes (Signed)
Patient ID: Renee [name], female   DOB: February 13, 1946, 73 y.o.   MRN: [id number] Trauma PA & I met at the bedside with her sister and multiple family members.  The patient's son was also on speaker phone.  We discussed her current situation and goals of care.  Answered their questions.  They asked about a follow-up head CT and I feel this is reasonable to assist with prognostication.  We will obtain this tonight.  Otherwise they are going to speak further as a family and make a decision.  Georganna Skeans, MD, MPH, FACS Trauma: [phone] General Surgery: [phone]

## 2018-04-19 ENCOUNTER — Inpatient Hospital Stay (HOSPITAL_COMMUNITY): Payer: Medicare Other

## 2018-04-19 LAB — GLUCOSE, CAPILLARY
Glucose-Capillary: 123 mg/dL — ABNORMAL HIGH (ref 70–99)
Glucose-Capillary: 150 mg/dL — ABNORMAL HIGH (ref 70–99)
Glucose-Capillary: 198 mg/dL — ABNORMAL HIGH (ref 70–99)
Glucose-Capillary: 133 mg/dL — ABNORMAL HIGH (ref 70–99)
Glucose-Capillary: 118 mg/dL — ABNORMAL HIGH (ref 70–99)
Glucose-Capillary: 173 mg/dL — ABNORMAL HIGH (ref 70–99)

## 2018-04-19 MED ORDER — CLONAZEPAM 0.125 MG PO TBDP
0.2500 mg | ORAL_TABLET | Freq: Two times a day (BID) | ORAL | Status: DC
  Administered 2018-04-19 – 2018-04-23 (×9): 0.25 mg via ORAL
  Filled 2018-04-19 (×9): qty 2

## 2018-04-19 NOTE — Progress Notes (Addendum)
Patient ID: Renee [name], female   DOB: 1945/05/13, 73 y.o.   MRN: [id number] Follow up - Trauma Critical Care  Patient Details:    Renee [name] is an 73 y.o. female.  Lines/tubes : Airway 7.5 mm (Active)  Secured at (cm) 24 cm 04/18/2018  3:06 AM  Measured From Lips 04/18/2018  3:06 AM  Secured Location Left 04/18/2018  3:06 AM  Secured By [employer] 04/18/2018  3:06 AM  Tube Holder Repositioned Yes 04/18/2018  3:06 AM  Cuff Pressure (cm H2O) 28 cm H2O 04/17/2018 11:44 PM  Site Condition Dry 04/17/2018 11:44 PM     PICC Double Lumen 04/17/18 PICC Right 45 cm 1 cm (Active)  Indication for Insertion or Continuance of Line Prolonged intravenous therapies 04/17/2018  8:00 PM  Exposed Catheter (cm) 1 cm 04/17/2018  6:00 PM  Site Assessment Clean;Dry;Intact 04/17/2018  8:00 PM  Lumen #1 Status Infusing 04/17/2018  8:00 PM  Lumen #2 Status Infusing;In-line blood sampling system in place 04/17/2018  8:00 PM  Dressing Type Transparent;Securing device 04/17/2018  8:00 PM  Dressing Status Clean;Dry;Intact;Antimicrobial disc in place 04/17/2018  8:00 PM  Dressing Change Due 04/24/18 04/17/2018  8:00 PM     NG/OG Tube Orogastric Center mouth Xray (Active)  External Length of Tube (cm) - (if applicable) 62 cm 04/11/2018  8:00 PM  Site Assessment Clean;Dry 04/17/2018  8:00 PM  Ongoing Placement Verification No change in cm markings or external length of tube from initial placement;No change in respiratory status;No acute changes, not attributed to clinical condition 04/17/2018  8:00 PM  Status Infusing tube feed 04/17/2018  8:00 PM  Intake (mL) 100 mL 04/12/2018  6:00 PM     External Urinary Catheter (Active)  Collection Container Dedicated Suction Canister 04/17/2018  8:00 PM  Securement Method Other (Comment) 04/17/2018  8:00 PM  Intervention Equipment Changed 04/18/2018 12:00 AM  Output (mL) 83 mL 04/18/2018  6:00 AM    Microbiology/Sepsis markers: Results for orders placed or performed  during the hospital encounter of 04/20/2018  Urine culture     Status: None   Collection Time: 04/04/18  1:42 AM  Result Value Ref Range Status   Specimen Description URINE, CATHETERIZED  Final   Special Requests NONE  Final   Culture   Final    NO GROWTH Performed at [facility], [address] [place], [place] [address]    Report Status 03/29/2018 FINAL  Final  MRSA PCR Screening     Status: Abnormal   Collection Time: 04/04/18  3:18 AM  Result Value Ref Range Status   MRSA by PCR POSITIVE (A) NEGATIVE Final    Comment:        The GeneXpert MRSA Assay (FDA approved for NASAL specimens only), is one component of a comprehensive MRSA colonization surveillance program. It is not intended to diagnose MRSA infection nor to guide or monitor treatment for MRSA infections. RESULT CALLED TO, READ BACK BY AND VERIFIED WITH: [name] RN [id number] 04/04/18 [name] Performed at [facility], [address] [place], [place] [address]   Culture, respiratory (non-expectorated)     Status: None   Collection Time: 04/09/18  7:44 AM  Result Value Ref Range Status   Specimen Description TRACHEAL ASPIRATE  Final   Special Requests Normal  Final   Gram Stain   Final    MODERATE WBC PRESENT, PREDOMINANTLY PMN FEW GRAM NEGATIVE RODS RARE GRAM POSITIVE COCCI IN CLUSTERS Performed at [facility], [address], [place], [place] [address]    Culture FEW ESCHERICHIA COLI  Final   Report Status 04/11/2018 FINAL  Final   Organism ID, Bacteria ESCHERICHIA COLI  Final      Susceptibility   Escherichia coli - MIC*    AMPICILLIN 8 SENSITIVE Sensitive     CEFAZOLIN <=4 SENSITIVE Sensitive     CEFEPIME <=1 SENSITIVE Sensitive     CEFTAZIDIME <=1 SENSITIVE Sensitive     CEFTRIAXONE <=1 SENSITIVE Sensitive     CIPROFLOXACIN <=0.25 SENSITIVE Sensitive     GENTAMICIN <=1 SENSITIVE Sensitive     IMIPENEM <=0.25 SENSITIVE Sensitive     TRIMETH/SULFA <=20 SENSITIVE Sensitive      AMPICILLIN/SULBACTAM 4 SENSITIVE Sensitive     PIP/TAZO <=4 SENSITIVE Sensitive     Extended ESBL NEGATIVE Sensitive     * FEW ESCHERICHIA COLI    Anti-infectives:  Anti-infectives (From admission, onward)   Start     Dose/Rate Route Frequency Ordered Stop   04/12/18 1800  ceFAZolin (ANCEF) IVPB 1 g/50 mL premix     1 g 100 mL/hr over 30 Minutes Intravenous Every 8 hours 04/12/18 1300 04/18/18 0159   04/11/18 1000  ceFEPIme (MAXIPIME) 1 g in sodium chloride 0.9 % 100 mL IVPB  Status:  Discontinued     1 g 200 mL/hr over 30 Minutes Intravenous Every 12 hours 04/11/18 0732 04/12/18 1300   04/07/2018 1800  ceFAZolin (ANCEF) IVPB 2g/100 mL premix     2 g 200 mL/hr over 30 Minutes Intravenous Every 8 hours 03/25/2018 1609 04/06/18 1520   03/21/2018 1258  vancomycin (VANCOCIN) powder  Status:  Discontinued       As needed 03/29/2018 1258 03/21/2018 1534   04/13/2018 1000  vancomycin (VANCOCIN) IVPB 1000 mg/200 mL premix     1,000 mg 200 mL/hr over 60 Minutes Intravenous To ShortStay Surgical 04/04/18 1257 04/14/2018 1242   04/08/2018 0930  ceFAZolin (ANCEF) IVPB 2g/100 mL premix  Status:  Discontinued     2 g 200 mL/hr over 30 Minutes Intravenous To ShortStay Surgical 04/02/2018 0712 03/25/2018 1540   04/04/18 0930  ceFAZolin (ANCEF) IVPB 1 g/50 mL premix  Status:  Discontinued     1 g 100 mL/hr over 30 Minutes Intravenous Every 8 hours 04/04/18 0840 03/20/2018 1609      Best Practice/Protocols:  VTE Prophylaxis: Lovenox (prophylaxtic dose) Continous Sedation  Consults: Treatment Team:  Tia Alert, MD Haddix, Gillie Manners, MD    Studies:  CT head yesterday with slightly increased right parafalcine hygroma and increased anterior C1-2 interval/ posterior subluxation  Events:  Subjective:    Overnight Issues:   Objective:  Vital signs for last 24 hours: Temp:  [99.1 F (37.3 C)-100.1 F (37.8 C)] 99.9 F (37.7 C) (01/31 0355) Pulse Rate:  [75-108] 75 (01/31 0700) Resp:  [10-29] 15 (01/31  0700) BP: (122-178)/(52-83) 130/55 (01/31 0700) SpO2:  [97 %-100 %] 100 % (01/31 0700) FiO2 (%):  [30 %] 30 % (01/31 0306)  Hemodynamic parameters for last 24 hours:    Intake/Output from previous day: 01/30 0701 - 01/31 0700 In: 2161.9 [I.V.:431.9; NG/GT:1730] Out: 2325 [Urine:2325]  Intake/Output this shift: No intake/output data recorded.  Vent settings for last 24 hours: Vent Mode: PRVC FiO2 (%):  [30 %] 30 % Set Rate:  [15 bmp] 15 bmp Vt Set:  [540 mL] 540 mL PEEP:  [5 cmH20] 5 cmH20 Pressure Support:  [10 cmH20] 10 cmH20 Plateau Pressure:  [16  cmH20-18 cmH20] 16 cmH20  Physical Exam:  General: on vent Neuro: opens eyes, not F/C, rt eye lateral deviation, grimaces HEENT/Neck: ETT and collar Resp: clear to auscultation bilaterally CVS: RRR GI: soft, NT Extremities: ortho dressings  Results for orders placed or performed during the hospital encounter of 04/19/2018 (from the past 24 hour(s))  CBC     Status: Abnormal   Collection Time: 04/18/18  9:00 AM  Result Value Ref Range   WBC 5.8 4.0 - 10.5 K/uL   RBC 2.81 (L) 3.87 - 5.11 MIL/uL   Hemoglobin 7.9 (L) 12.0 - 15.0 g/dL   HCT 16.126.5 (L) 09.636.0 - 04.546.0 %   MCV 94.3 80.0 - 100.0 fL   MCH 28.1 26.0 - 34.0 pg   MCHC 29.8 (L) 30.0 - 36.0 g/dL   RDW 40.915.6 (H) 81.111.5 - 91.415.5 %   Platelets 386 150 - 400 K/uL   nRBC 0.0 0.0 - 0.2 %  Basic metabolic panel     Status: Abnormal   Collection Time: 04/18/18  9:00 AM  Result Value Ref Range   Sodium 140 135 - 145 mmol/L   Potassium 3.6 3.5 - 5.1 mmol/L   Chloride 100 98 - 111 mmol/L   CO2 32 22 - 32 mmol/L   Glucose, Bld 149 (H) 70 - 99 mg/dL   BUN 33 (H) 8 - 23 mg/dL   Creatinine, Ser 7.820.69 0.44 - 1.00 mg/dL   Calcium 8.4 (L) 8.9 - 10.3 mg/dL   GFR calc non Af Amer >60 >60 mL/min   GFR calc Af Amer >60 >60 mL/min   Anion gap 8 5 - 15  Glucose, capillary     Status: Abnormal   Collection Time: 04/18/18 11:27 AM  Result Value Ref Range   Glucose-Capillary 188 (H) 70 - 99  mg/dL   Comment 1 Notify RN    Comment 2 Document in Chart   Glucose, capillary     Status: Abnormal   Collection Time: 04/18/18  3:20 PM  Result Value Ref Range   Glucose-Capillary 156 (H) 70 - 99 mg/dL   Comment 1 Notify RN    Comment 2 Document in Chart   Glucose, capillary     Status: Abnormal   Collection Time: 04/18/18  7:25 PM  Result Value Ref Range   Glucose-Capillary 127 (H) 70 - 99 mg/dL  Glucose, capillary     Status: Abnormal   Collection Time: 04/18/18 11:04 PM  Result Value Ref Range   Glucose-Capillary 180 (H) 70 - 99 mg/dL  Glucose, capillary     Status: Abnormal   Collection Time: 04/19/18  3:31 AM  Result Value Ref Range   Glucose-Capillary 123 (H) 70 - 99 mg/dL    Assessment & Plan: Present on Admission: **None**    LOS: 15 days   Additional comments:I reviewed the patient's new clinical lab test results. [facility] Kitchen.  [facility]  [facility]BI/SAH- per Dr. Yetta BarreJones, F/U CT head 1/30with some increase in hygroma and redemonstration of unstable C1-2 fx.Agree with Dr. Yetta BarreJones aggressive care may not be indicated. C1-2 FXs- collar per Dr. Yetta BarreJones B rib FX (R 2-9; L2, 4-7) with small R PTX Acute hypoxic ventilator dependent respiratory failure- failed extubation 1/20. con't to wean as able. Goals of care. Guaifenesin for secretions. HTN- Lopressor Acute urinary retention- on urecholine, improved and having appropriate uop with wick. ID- Ancef for e coli PNA to stopped 1/30. Tmax 100.1, no leukocytosis ABL anemia- hgb stable, follow L ulna and 4th finger FX -  ORIF L ulna 1/17 Dr. Jena GaussHaddix  R scapula FX- per Dr. Jena GaussHaddix B tibial plateau FX- ORIF L side 1/17 Dr. Jena GaussHaddix  R fibula FX- per Dr. Jena GaussHaddix R bimal ankle FX- ORIF 1/17 Dr. Jena GaussHaddix Hyperglycemia- SSI HX schizophrenia per family- has not been on meds since last summer VTE- plexipulse,Lovenox FEN-TF Dispo- ICU, prognosis for further recovery doubtful. Her sister is struggling with decision making. Leaning  toward extubation. If that is the case, CSW may be able to arrange visit by son who is in prison. Awaiting decision from family. Otherwise trach early next week.  Wean klonopin to off- will decrease dose by half today, plan to wean to off over the weekend then see if we can wean seroquel.   Critical Care Total Time*: 31 Minutes  Berna Buehelsea A Cochise Dinneen MD  04/19/2018  *Care during the described time interval was provided by me. I have reviewed this patient's available data, including medical history, events of note, physical examination and test results as part of my evaluation.

## 2018-04-19 NOTE — Progress Notes (Signed)
Patient ID: Renee [name], female   DOB: 05/21/1945, 73 y.o.   MRN: [id number] I called her niece, Philippa Chester, and discussed the results of the F/U CT head. The family is going to continue to discuss goals of care.  Violeta Gelinas, MD, MPH, FACS Trauma: [phone] General Surgery: [phone]
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
## 2018-04-20 LAB — GLUCOSE, CAPILLARY
GLUCOSE-CAPILLARY: 202 mg/dL — AB (ref 70–99)
Glucose-Capillary: 100 mg/dL — ABNORMAL HIGH (ref 70–99)
Glucose-Capillary: 161 mg/dL — ABNORMAL HIGH (ref 70–99)
Glucose-Capillary: 99 mg/dL (ref 70–99)
Glucose-Capillary: 152 mg/dL — ABNORMAL HIGH (ref 70–99)
Glucose-Capillary: 159 mg/dL — ABNORMAL HIGH (ref 70–99)

## 2018-04-20 MED ORDER — SODIUM CHLORIDE 0.9 % IV SOLN: INTRAVENOUS | Status: DC | PRN

## 2018-04-20 NOTE — Progress Notes (Signed)
No new issues or problems from our standpoint.  Patient will awaken to voice.  Will occasionally move lower extremities to commands.  No new recommendations.

## 2018-04-20 NOTE — Progress Notes (Signed)
15 Days Post-Op   Subjective/Chief Complaint: No acute events overnight   Objective: Vital signs in last 24 hours: Temp:  [99 F (37.2 C)-99.8 F (37.7 C)] 99.5 F (37.5 C) (02/01 0348) Pulse Rate:  [72-166] 80 (02/01 0754) Resp:  [12-16] 15 (02/01 0754) BP: (95-180)/(46-79) 128/57 (02/01 0700) SpO2:  [81 %-100 %] 100 % (02/01 0754) FiO2 (%):  [30 %] 30 % (02/01 0754) Last BM Date: 04/19/18  Intake/Output from previous day: 01/31 0701 - 02/01 0700 In: 1036.3 [I.V.:251.3; NG/GT:785] Out: 1700 [Urine:1700] Intake/Output this shift: No intake/output data recorded.  General: on vent Neuro: does not open eyes spontaneously, not following commands, grimaces to pain,  Slight movement in RUQ HEENT/Neck: ETT and collar Resp: clear to auscultation bilaterally CVS: RRR GI: soft, NT Extremities: ortho dressings  Lab Results:  Recent Labs    04/18/18 0900  WBC 5.8  HGB 7.9*  HCT 26.5*  PLT 386   BMET Recent Labs    04/18/18 0900  NA 140  K 3.6  CL 100  CO2 32  GLUCOSE 149*  BUN 33*  CREATININE 0.69  CALCIUM 8.4*   PT/INR No results for input(s): LABPROT, INR in the last 72 hours. ABG No results for input(s): PHART, HCO3 in the last 72 hours.  Invalid input(s): PCO2, PO2  Studies/Results: Ct Head Wo Contrast  Result Date: 04/19/2018 CLINICAL DATA:  73 y/o  F; subarachnoid hemorrhage for follow-up. EXAM: CT HEAD WITHOUT CONTRAST TECHNIQUE: Contiguous axial images were obtained from the base of the skull through the vertex without intravenous contrast. COMPARISON:  04/12/2018 CT head. 12-17-2018 CT cervical spine. FINDINGS: Brain: Increased right parafalcine hygroma measuring 7 mm, previously 5 mm. Stable left convexity hygroma measuring 11 mm, previously 11 mm. Stable associated mass effect sulcal effacement over the left cerebral convexity and 3 mm of left-to-right midline shift. Interval dispersion of blood products within multiple small cortical contusions over  the frontal convexities. Trace residual subarachnoid hemorrhage. No new stroke, hemorrhage, focus of mass effect, or downward herniation. Vascular: Embolic material within multiple superficial vessels over the posterior cerebral convexity. Calcific atherosclerosis of carotid siphons. Skull: Normal. Negative for fracture or focal lesion. Sinuses/Orbits: C1 and C2 fractures better characterized on prior CT of the cervical spine. Increased anterior C1-2 interval and posterior subluxation of odontoid process relative to the clivus indicating instability. Other: None. IMPRESSION: 1. Interval dispersion of blood products within multiple small cortical contusions over the frontal convexities. Trace residual subarachnoid hemorrhage. 2. Increased right parafalcine hygroma measuring 7 mm, previously 5 mm. 3. Stable left convexity hygroma measuring 11 mm, previously 11 mm. 4. Stable 3 mm of left-to-right midline shift. 5. C1 and C2 fractures better characterized on prior CT of the cervical spine. Increased anterior C1-2 interval and posterior subluxation of odontoid process relative to the clivus indicating instability, incompletely visualized. Electronically Signed   By: Mitzi HansenLance  Furusawa-Stratton M.D.   On: 04/19/2018 04:37    Anti-infectives: Anti-infectives (From admission, onward)   Start     Dose/Rate Route Frequency Ordered Stop   04/12/18 1800  ceFAZolin (ANCEF) IVPB 1 g/50 mL premix     1 g 100 mL/hr over 30 Minutes Intravenous Every 8 hours 04/12/18 1300 04/18/18 0159   04/11/18 1000  ceFEPIme (MAXIPIME) 1 g in sodium chloride 0.9 % 100 mL IVPB  Status:  Discontinued     1 g 200 mL/hr over 30 Minutes Intravenous Every 12 hours 04/11/18 0732 04/12/18 1300   03/24/2018 1800  ceFAZolin (ANCEF) IVPB  2g/100 mL premix     2 g 200 mL/hr over 30 Minutes Intravenous Every 8 hours 04/10/2018 1609 04/06/18 1520   03/25/2018 1258  vancomycin (VANCOCIN) powder  Status:  Discontinued       As needed 03/31/2018 1258 03/25/2018  1534   04/12/2018 1000  vancomycin (VANCOCIN) IVPB 1000 mg/200 mL premix     1,000 mg 200 mL/hr over 60 Minutes Intravenous To ShortStay Surgical 04/04/18 1257 03/27/2018 1242   03/25/2018 0930  ceFAZolin (ANCEF) IVPB 2g/100 mL premix  Status:  Discontinued     2 g 200 mL/hr over 30 Minutes Intravenous To ShortStay Surgical 03/22/2018 0712 04/17/2018 1540   04/04/18 0930  ceFAZolin (ANCEF) IVPB 1 g/50 mL premix  Status:  Discontinued     1 g 100 mL/hr over 30 Minutes Intravenous Every 8 hours 04/04/18 0840 04/01/2018 1609      Assessment/Plan: [facility] 04/03/14  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Agree with Dr. Yetta Barre aggressive care may not be indicated. C1-2 FXs- collar per Dr. Yetta Barre B rib FX (R 2-9; L2, 4-7) with small R PTX Acute hypoxic ventilator dependent respiratory failure- failed extubation 1/20. con't to wean as able. Goals of care. Guaifenesin for secretions. HTN- Lopressor Acute urinary retention- on urecholine, improved and having appropriate uop with wick. ID- Ancef fore coli PNAto stopped 1/30. Tmax 100.1, no leukocytosis ABL anemia- hgb stable, follow L ulna and 4th finger FX -ORIF L ulna 1/17 Dr. Jena Gauss  R scapula FX- per Dr. Jena Gauss B tibial plateau FX- ORIF L side 1/17 Dr. Jena Gauss  R fibula FX- per Dr. Jena Gauss R bimal ankle FX- ORIF 1/17 Dr. Jena Gauss Hyperglycemia- SSI HX schizophrenia per family- has not been on meds since last summer VTE- plexipulse,Lovenox FEN-TF Dispo- ICU, prognosis for further recovery doubtful. Her sister is struggling with decision making. Leaning toward extubation. If that is the case, CSW may be able to arrange visit by son who is in prison. Awaiting decision from family. Otherwise trach early next week.     LOS: 16 days    Wynona Luna 04/20/2018

## 2018-04-20 DEATH — deceased

## 2018-04-21 LAB — GLUCOSE, CAPILLARY
Glucose-Capillary: 124 mg/dL — ABNORMAL HIGH (ref 70–99)
Glucose-Capillary: 132 mg/dL — ABNORMAL HIGH (ref 70–99)
Glucose-Capillary: 181 mg/dL — ABNORMAL HIGH (ref 70–99)
Glucose-Capillary: 115 mg/dL — ABNORMAL HIGH (ref 70–99)
Glucose-Capillary: 121 mg/dL — ABNORMAL HIGH (ref 70–99)
Glucose-Capillary: 156 mg/dL — ABNORMAL HIGH (ref 70–99)

## 2018-04-21 NOTE — Progress Notes (Signed)
Overall stable.  No new issues or problems.  Patient remains on ventilator.  She will open her eyes to voice.  She appears minimally aware.  She moves her right upper extremity purposefully.  Her left upper extremity is immobilized.  Her lower extremities move spontaneously and she will occasionally wiggle toes to command.  Overall stable.  Continue cervical spine collar and supportive care.  No new recommendations.

## 2018-04-21 NOTE — Progress Notes (Signed)
Patient ID: Renee [name], female   DOB: 03/21/1945, 73 y.o.   MRN: [id number] Follow up - Trauma Critical Care  Patient Details:    Renee BeckwithLena Kelly Garretson is an 73 y.o. female.  Lines/tubes : Airway 7.5 mm (Active)  Secured at (cm) 24 cm 04/21/2018  3:30 AM  Measured From Lips 04/21/2018  3:30 AM  Secured Location Left 04/21/2018  3:30 AM  Secured By [employer] Tube Holder 04/21/2018  3:30 AM  Tube Holder Repositioned Yes 04/21/2018  3:30 AM  Cuff Pressure (cm H2O) 24 cm H2O 04/20/2018  8:14 PM  Site Condition Dry 04/21/2018  3:30 AM     PICC Double Lumen 04/17/18 PICC Right 45 cm 1 cm (Active)  Indication for Insertion or Continuance of Line Prolonged intravenous therapies 04/20/2018  8:00 PM  Exposed Catheter (cm) 1 cm 04/18/2018  8:00 AM  Site Assessment Clean;Dry;Intact 04/20/2018  8:00 PM  Lumen #1 Status Infusing 04/20/2018  8:00 PM  Lumen #2 Status In-line blood sampling system in place 04/20/2018  8:00 PM  Dressing Type Transparent;Occlusive;Securing device 04/20/2018  8:00 PM  Dressing Status Clean;Dry;Intact;Antimicrobial disc in place 04/20/2018  8:00 PM  Line Care Connections checked and tightened 04/20/2018  8:00 PM  Dressing Change Due 04/24/18 04/20/2018  8:00 PM     NG/OG Tube Orogastric Center mouth Xray (Active)  External Length of Tube (cm) - (if applicable) 62 cm 04/20/2018  8:00 PM  Site Assessment Clean;Dry 04/20/2018  8:00 PM  Ongoing Placement Verification No acute changes, not attributed to clinical condition;No change in respiratory status;No change in cm markings or external length of tube from initial placement 04/20/2018  8:00 AM  Status Infusing tube feed 04/20/2018  8:00 PM  Intake (mL) 30 mL 04/21/2018  5:24 AM     External Urinary Catheter (Active)  Collection Container Dedicated Suction Canister 04/20/2018  8:00 AM  Securement Method Tape 04/20/2018  8:00 AM  Intervention Equipment Changed 04/19/2018  8:00 PM  Output (mL) 250 mL 04/21/2018 12:00 AM    Microbiology/Sepsis markers: Results  for orders placed or performed during the hospital encounter of 04/13/2018  Urine culture     Status: None   Collection Time: 04/04/18  1:42 AM  Result Value Ref Range Status   Specimen Description URINE, CATHETERIZED  Final   Special Requests NONE  Final   Culture   Final    NO GROWTH Performed at [facility], [address] [place], [place] [address]    Report Status 04/16/2018 FINAL  Final  MRSA PCR Screening     Status: Abnormal   Collection Time: 04/04/18  3:18 AM  Result Value Ref Range Status   MRSA by PCR POSITIVE (A) NEGATIVE Final    Comment:        The GeneXpert MRSA Assay (FDA approved for NASAL specimens only), is one component of a comprehensive MRSA colonization surveillance program. It is not intended to diagnose MRSA infection nor to guide or monitor treatment for MRSA infections. RESULT CALLED TO, READ BACK BY AND VERIFIED WITH: Rene KocherM PLUMMER RN [id number] 04/04/18 [name] Performed at [facility], [address] [place], [place] [address]   Culture, respiratory (non-expectorated)     Status: None   Collection Time: 04/09/18  7:44 AM  Result Value Ref Range Status   Specimen Description TRACHEAL ASPIRATE  Final   Special Requests Normal  Final   Gram Stain   Final    MODERATE WBC PRESENT, PREDOMINANTLY PMN FEW  GRAM NEGATIVE RODS RARE GRAM POSITIVE COCCI IN CLUSTERS Performed at [facility], [address], [place], [place] [address]    Culture FEW ESCHERICHIA COLI  Final   Report Status 04/11/2018 FINAL  Final   Organism ID, Bacteria ESCHERICHIA COLI  Final      Susceptibility   Escherichia coli - MIC*    AMPICILLIN 8 SENSITIVE Sensitive     CEFAZOLIN <=4 SENSITIVE Sensitive     CEFEPIME <=1 SENSITIVE Sensitive     CEFTAZIDIME <=1 SENSITIVE Sensitive     CEFTRIAXONE <=1 SENSITIVE Sensitive     CIPROFLOXACIN <=0.25 SENSITIVE Sensitive     GENTAMICIN <=1 SENSITIVE Sensitive     IMIPENEM <=0.25 SENSITIVE Sensitive     TRIMETH/SULFA <=20  SENSITIVE Sensitive     AMPICILLIN/SULBACTAM 4 SENSITIVE Sensitive     PIP/TAZO <=4 SENSITIVE Sensitive     Extended ESBL NEGATIVE Sensitive     * FEW ESCHERICHIA COLI    Anti-infectives:  Anti-infectives (From admission, onward)   Start     Dose/Rate Route Frequency Ordered Stop   04/12/18 1800  ceFAZolin (ANCEF) IVPB 1 g/50 mL premix     1 g 100 mL/hr over 30 Minutes Intravenous Every 8 hours 04/12/18 1300 04/18/18 0159   04/11/18 1000  ceFEPIme (MAXIPIME) 1 g in sodium chloride 0.9 % 100 mL IVPB  Status:  Discontinued     1 g 200 mL/hr over 30 Minutes Intravenous Every 12 hours 04/11/18 0732 04/12/18 1300   05/03/2018 1800  ceFAZolin (ANCEF) IVPB 2g/100 mL premix     2 g 200 mL/hr over 30 Minutes Intravenous Every 8 hours May 03, 2018 1609 04/06/18 1520   May 03, 2018 1258  vancomycin (VANCOCIN) powder  Status:  Discontinued       As needed May 03, 2018 1258 05/03/2018 1534   03-May-2018 1000  vancomycin (VANCOCIN) IVPB 1000 mg/200 mL premix     1,000 mg 200 mL/hr over 60 Minutes Intravenous To ShortStay Surgical 04/04/18 1257 May 03, 2018 1242   03-May-2018 0930  ceFAZolin (ANCEF) IVPB 2g/100 mL premix  Status:  Discontinued     2 g 200 mL/hr over 30 Minutes Intravenous To ShortStay Surgical May 03, 2018 0712 2018-05-03 1540   04/04/18 0930  ceFAZolin (ANCEF) IVPB 1 g/50 mL premix  Status:  Discontinued     1 g 100 mL/hr over 30 Minutes Intravenous Every 8 hours 04/04/18 0840 05/03/2018 1609      Best Practice/Protocols:  VTE Prophylaxis: Lovenox (prophylaxtic dose) Continous Sedation  Consults: Treatment Team:  Tia Alert, MD Haddix, Gillie Manners, MD    Studies:    Events:  Subjective:    Overnight Issues:   Objective:  Vital signs for last 24 hours: Temp:  [98.7 F (37.1 C)-100 F (37.8 C)] 100 F (37.8 C) (02/02 0400) Pulse Rate:  [74-104] 74 (02/02 0700) Resp:  [15-20] 15 (02/02 0700) BP: (113-142)/(49-103) 125/54 (02/02 0700) SpO2:  [97 %-100 %] 100 % (02/02 0700) FiO2 (%):  [30  %] 30 % (02/02 0400)  Hemodynamic parameters for last 24 hours:    Intake/Output from previous day: 02/01 0701 - 02/02 0700 In: 2617.7 [I.V.:467.7; NG/GT:2150] Out: 1250 [Urine:1250]  Intake/Output this shift: No intake/output data recorded.  Vent settings for last 24 hours: Vent Mode: PRVC FiO2 (%):  [30 %] 30 % Set Rate:  [15 bmp] 15 bmp Vt Set:  [540 mL] 540 mL PEEP:  [5 cmH20] 5 cmH20 Plateau Pressure:  [13 cmH20-17 cmH20] 16 cmH20  Physical Exam:  General: on  vent Neuro: grimace to pain, not F/C HEENT/Neck: ETT and collar Resp: few rhonchi CVS: RRR GI: soft, NT Extremities: ortho dressings/splints  Results for orders placed or performed during the hospital encounter of 03/23/2018 (from the past 24 hour(s))  Glucose, capillary     Status: Abnormal   Collection Time: 04/20/18 11:21 AM  Result Value Ref Range   Glucose-Capillary 202 (H) 70 - 99 mg/dL   Comment 1 Notify RN    Comment 2 Document in Chart   Glucose, capillary     Status: None   Collection Time: 04/20/18  3:31 PM  Result Value Ref Range   Glucose-Capillary 99 70 - 99 mg/dL   Comment 1 Notify RN    Comment 2 Document in Chart   Glucose, capillary     Status: Abnormal   Collection Time: 04/20/18  8:19 PM  Result Value Ref Range   Glucose-Capillary 152 (H) 70 - 99 mg/dL  Glucose, capillary     Status: Abnormal   Collection Time: 04/20/18 11:15 PM  Result Value Ref Range   Glucose-Capillary 159 (H) 70 - 99 mg/dL  Glucose, capillary     Status: Abnormal   Collection Time: 04/21/18  3:58 AM  Result Value Ref Range   Glucose-Capillary 124 (H) 70 - 99 mg/dL  Glucose, capillary     Status: Abnormal   Collection Time: 04/21/18  7:46 AM  Result Value Ref Range   Glucose-Capillary 132 (H) 70 - 99 mg/dL   Comment 1 Notify RN    Comment 2 Document in Chart     Assessment & Plan: Present on Admission: **None**    LOS: 17 days   Additional comments:I reviewed the patient's new clinical lab test  results. 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 C1-2 FXs- collar per Dr. Yetta Barre B rib FX (R 2-9; L2, 4-7) with small R PTX Acute hypoxic ventilator dependent respiratory failure- failed extubation 1/20. con't to wean as able. Goals of care. Guaifenesin for secretions. HTN- Lopressor Acute urinary retention- on urecholine, improved and having appropriate uop with wick. ID- Ancef fore coli PNAto stopped 1/30. Tmax 100.1, no leukocytosis ABL anemia- hgb stable, follow L ulna and 4th finger FX -ORIF L ulna 1/17 Dr. Jena Gauss  R scapula FX- per Dr. Jena Gauss B tibial plateau FX- ORIF L side 1/17 Dr. Jena Gauss  R fibula FX- per Dr. Jena Gauss R bimal ankle FX- ORIF 1/17 Dr. Jena Gauss Hyperglycemia- SSI HX schizophrenia per family- has not been on meds since last summer VTE- plexipulse,Lovenox FEN-TF Dispo- ICU, prognosis for further recovery doubtful. Await family decision about terminal extubation. Critical Care Total Time*: 34 Minutes  Violeta Gelinas, MD, MPH, [facility] Trauma: [phone] General Surgery: [phone]  04/21/2018  *Care during the described time interval was provided by me. I have reviewed this patient's available data, including medical history, events of note, physical examination and test results as part of my evaluation.

## 2018-04-22 ENCOUNTER — Inpatient Hospital Stay (HOSPITAL_COMMUNITY): Payer: Medicare Other

## 2018-04-22 LAB — BASIC METABOLIC PANEL
Anion gap: 10 (ref 5–15)
BUN: 31 mg/dL — ABNORMAL HIGH (ref 8–23)
CO2: 24 mmol/L (ref 22–32)
Calcium: 7.9 mg/dL — ABNORMAL LOW (ref 8.9–10.3)
Chloride: 106 mmol/L (ref 98–111)
Creatinine, Ser: 0.71 mg/dL (ref 0.44–1.00)
GFR calc Af Amer: >60 mL/min (ref >60)
GFR calc non Af Amer: >60 mL/min (ref >60)
Glucose, Bld: 158 mg/dL — ABNORMAL HIGH (ref 70–99)
POTASSIUM: 3.4 mmol/L — AB (ref 3.5–5.1)
Sodium: 140 mmol/L (ref 135–145)

## 2018-04-22 LAB — CBC
HCT: 25.5 % — ABNORMAL LOW (ref 36.0–46.0)
Hemoglobin: 7.4 g/dL — ABNORMAL LOW (ref 12.0–15.0)
MCH: 27.6 pg (ref 26.0–34.0)
MCHC: 29 g/dL — ABNORMAL LOW (ref 30.0–36.0)
MCV: 95.1 fL (ref 80.0–100.0)
Platelets: 278 10*3/uL (ref 150–400)
RBC: 2.68 MIL/uL — AB (ref 3.87–5.11)
RDW: 15.9 % — ABNORMAL HIGH (ref 11.5–15.5)
WBC: 7.9 10*3/uL (ref 4.0–10.5)
nRBC: 0 % (ref 0.0–0.2)

## 2018-04-22 LAB — GLUCOSE, CAPILLARY
Glucose-Capillary: 161 mg/dL — ABNORMAL HIGH (ref 70–99)
Glucose-Capillary: 138 mg/dL — ABNORMAL HIGH (ref 70–99)
Glucose-Capillary: 116 mg/dL — ABNORMAL HIGH (ref 70–99)
Glucose-Capillary: 148 mg/dL — ABNORMAL HIGH (ref 70–99)
Glucose-Capillary: 140 mg/dL — ABNORMAL HIGH (ref 70–99)
Glucose-Capillary: 161 mg/dL — ABNORMAL HIGH (ref 70–99)

## 2018-04-22 NOTE — Progress Notes (Signed)
Follow up - Trauma and Critical Care  Patient Details:    Renee [name] is an 73 y.o. female.  Lines/tubes : Airway 7.5 mm (Active)  Secured at (cm) 23 cm 04/22/2018  7:44 AM  Measured From Lips 04/22/2018  7:44 AM  Secured Location Left 04/22/2018  7:44 AM  Secured By [employer] 04/22/2018  7:44 AM  Tube Holder Repositioned Yes 04/22/2018  7:44 AM  Cuff Pressure (cm H2O) 28 cm H2O 04/21/2018  7:43 PM  Site Condition Dry 04/22/2018  7:44 AM     PICC Double Lumen 04/17/18 PICC Right 45 cm 1 cm (Active)  Indication for Insertion or Continuance of Line Prolonged intravenous therapies;Poor Vasculature-patient has had multiple peripheral attempts or PIVs lasting less than 24 hours 04/21/2018  8:00 PM  Exposed Catheter (cm) 1 cm 04/18/2018  8:00 AM  Site Assessment Clean;Dry;Intact 04/21/2018  8:00 PM  Lumen #1 Status Infusing 04/21/2018  8:00 PM  Lumen #2 Status In-line blood sampling system in place 04/21/2018  8:00 PM  Dressing Type Transparent;Occlusive;Securing device 04/21/2018  8:00 PM  Dressing Status Clean;Dry;Intact;Antimicrobial disc in place 04/21/2018  8:00 PM  Line Care Connections checked and tightened 04/21/2018 11:00 AM  Dressing Change Due 04/24/18 04/21/2018  8:00 PM     NG/OG Tube Orogastric Center mouth Xray (Active)  External Length of Tube (cm) - (if applicable) 62 cm 04/20/2018  8:00 PM  Site Assessment Clean;Dry 04/22/2018  4:00 AM  Ongoing Placement Verification No acute changes, not attributed to clinical condition;No change in respiratory status;No change in cm markings or external length of tube from initial placement 04/20/2018  8:00 AM  Status Infusing tube feed 04/22/2018  4:00 AM  Intake (mL) 30 mL 04/21/2018  5:24 AM     External Urinary Catheter (Active)  Collection Container Dedicated Suction Canister 04/22/2018  4:00 AM  Securement Method Tape 04/22/2018  4:00 AM  Intervention Equipment Changed 04/19/2018  8:00 PM  Output (mL) 1350 mL 04/21/2018  6:23 PM     Microbiology/Sepsis markers: Results for orders placed or performed during the hospital encounter of 04-09-2018  Urine culture     Status: None   Collection Time: 04/04/18  1:42 AM  Result Value Ref Range Status   Specimen Description URINE, CATHETERIZED  Final   Special Requests NONE  Final   Culture   Final    NO GROWTH Performed at [facility], [address] [place], [place] [address]    Report Status 04/18/2018 FINAL  Final  MRSA PCR Screening     Status: Abnormal   Collection Time: 04/04/18  3:18 AM  Result Value Ref Range Status   MRSA by PCR POSITIVE (A) NEGATIVE Final    Comment:        The GeneXpert MRSA Assay (FDA approved for NASAL specimens only), is one component of a comprehensive MRSA colonization surveillance program. It is not intended to diagnose MRSA infection nor to guide or monitor treatment for MRSA infections. RESULT CALLED TO, READ BACK BY AND VERIFIED WITH: [name] RN [id number] 04/04/18 [name] Performed at [facility], [address] [place], [place] [address]   Culture, respiratory (non-expectorated)     Status: None   Collection Time: 04/09/18  7:44 AM  Result Value Ref Range Status   Specimen Description TRACHEAL ASPIRATE  Final   Special Requests Normal  Final   Gram Stain   Final    MODERATE WBC PRESENT, PREDOMINANTLY PMN FEW GRAM NEGATIVE  RODS RARE GRAM POSITIVE COCCI IN CLUSTERS Performed at [facility], [address] [place], [place] [address]    Culture FEW ESCHERICHIA COLI  Final   Report Status 04/11/2018 FINAL  Final   Organism ID, Bacteria ESCHERICHIA COLI  Final      Susceptibility   Escherichia coli - MIC*    AMPICILLIN 8 SENSITIVE Sensitive     CEFAZOLIN <=4 SENSITIVE Sensitive     CEFEPIME <=1 SENSITIVE Sensitive     CEFTAZIDIME <=1 SENSITIVE Sensitive     CEFTRIAXONE <=1 SENSITIVE Sensitive     CIPROFLOXACIN <=0.25 SENSITIVE Sensitive     GENTAMICIN <=1 SENSITIVE Sensitive     IMIPENEM <=0.25  SENSITIVE Sensitive     TRIMETH/SULFA <=20 SENSITIVE Sensitive     AMPICILLIN/SULBACTAM 4 SENSITIVE Sensitive     PIP/TAZO <=4 SENSITIVE Sensitive     Extended ESBL NEGATIVE Sensitive     * FEW ESCHERICHIA COLI    Anti-infectives:  Anti-infectives (From admission, onward)   Start     Dose/Rate Route Frequency Ordered Stop   04/12/18 1800  ceFAZolin (ANCEF) IVPB 1 g/50 mL premix     1 g 100 mL/hr over 30 Minutes Intravenous Every 8 hours 04/12/18 1300 04/18/18 0159   04/11/18 1000  ceFEPIme (MAXIPIME) 1 g in sodium chloride 0.9 % 100 mL IVPB  Status:  Discontinued     1 g 200 mL/hr over 30 Minutes Intravenous Every 12 hours 04/11/18 0732 04/12/18 1300   04/12/2018 1800  ceFAZolin (ANCEF) IVPB 2g/100 mL premix     2 g 200 mL/hr over 30 Minutes Intravenous Every 8 hours 03/28/2018 1609 04/06/18 1520   03/23/2018 1258  vancomycin (VANCOCIN) powder  Status:  Discontinued       As needed 03/20/2018 1258 03/28/2018 1534   03/22/2018 1000  vancomycin (VANCOCIN) IVPB 1000 mg/200 mL premix     1,000 mg 200 mL/hr over 60 Minutes Intravenous To ShortStay Surgical 04/04/18 1257 04/12/2018 1242   03/26/2018 0930  ceFAZolin (ANCEF) IVPB 2g/100 mL premix  Status:  Discontinued     2 g 200 mL/hr over 30 Minutes Intravenous To ShortStay Surgical 03/30/2018 0712 03/28/2018 1540   04/04/18 0930  ceFAZolin (ANCEF) IVPB 1 g/50 mL premix  Status:  Discontinued     1 g 100 mL/hr over 30 Minutes Intravenous Every 8 hours 04/04/18 0840 04/08/2018 1609      Best Practice/Protocols:  VTE Prophylaxis: Lovenox (prophylaxtic dose) and Mechanical Intermittent Sedation  Consults: Treatment Team:  Tia Alert, MD Haddix, Gillie Manners, MD    Events:  Subjective:    Overnight Issues: No new issues   Minimally responsive   Objective:  Vital signs for last 24 hours: Temp:  [99.4 F (37.4 C)-101.2 F (38.4 C)] 99.6 F (37.6 C) (02/03 0400) Pulse Rate:  [73-121] 77 (02/03 0744) Resp:  [15-22] 16 (02/03 0744) BP:  (102-187)/(44-94) 127/53 (02/03 0744) SpO2:  [95 %-100 %] 100 % (02/03 0700) FiO2 (%):  [30 %] 30 % (02/03 0744)  Hemodynamic parameters for last 24 hours:    Intake/Output from previous day: 02/02 0701 - 02/03 0700 In: 1409 [P.O.:50; I.V.:549; NG/GT:810] Out: 2000 [Urine:2000]  Intake/Output this shift: No intake/output data recorded.  Vent settings for last 24 hours: Vent Mode: PSV;CPAP FiO2 (%):  [30 %] 30 % Set Rate:  [15 bmp] 15 bmp Vt Set:  [450 mL-540 mL] 540 mL PEEP:  [5 cmH20] 5 cmH20 Pressure Support:  [10 cmH20] 10 cmH20 Plateau Pressure:  [  10 cmH20-28 cmH20] 10 cmH20  Physical Exam:  General: on vent sedated  Neuro: RASS 0 Resp: clear to auscultation bilaterally CVS: regular rate and rhythm, S1, S2 normal, no murmur, click, rub or gallop GI: soft, nontender, BS WNL, no r/g Extremities: unequal size  Results for orders placed or performed during the hospital encounter of 04/09/2018 (from the past 24 hour(s))  Glucose, capillary     Status: Abnormal   Collection Time: 04/21/18 11:17 AM  Result Value Ref Range   Glucose-Capillary 181 (H) 70 - 99 mg/dL   Comment 1 Notify RN    Comment 2 Document in Chart   Glucose, capillary     Status: Abnormal   Collection Time: 04/21/18  3:14 PM  Result Value Ref Range   Glucose-Capillary 115 (H) 70 - 99 mg/dL   Comment 1 Notify RN    Comment 2 Document in Chart   Glucose, capillary     Status: Abnormal   Collection Time: 04/21/18  7:15 PM  Result Value Ref Range   Glucose-Capillary 121 (H) 70 - 99 mg/dL  Glucose, capillary     Status: Abnormal   Collection Time: 04/21/18 11:04 PM  Result Value Ref Range   Glucose-Capillary 156 (H) 70 - 99 mg/dL  Glucose, capillary     Status: Abnormal   Collection Time: 04/22/18  3:07 AM  Result Value Ref Range   Glucose-Capillary 161 (H) 70 - 99 mg/dL  CBC     Status: Abnormal   Collection Time: 04/22/18  4:06 AM  Result Value Ref Range   WBC 7.9 4.0 - 10.5 K/uL   RBC 2.68 (L)  3.87 - 5.11 MIL/uL   Hemoglobin 7.4 (L) 12.0 - 15.0 g/dL   HCT 75.6 (L) 43.3 - 29.5 %   MCV 95.1 80.0 - 100.0 fL   MCH 27.6 26.0 - 34.0 pg   MCHC 29.0 (L) 30.0 - 36.0 g/dL   RDW 18.8 (H) 41.6 - 60.6 %   Platelets 278 150 - 400 K/uL   nRBC 0.0 0.0 - 0.2 %  Basic metabolic panel     Status: Abnormal   Collection Time: 04/22/18  4:06 AM  Result Value Ref Range   Sodium 140 135 - 145 mmol/L   Potassium 3.4 (L) 3.5 - 5.1 mmol/L   Chloride 106 98 - 111 mmol/L   CO2 24 22 - 32 mmol/L   Glucose, Bld 158 (H) 70 - 99 mg/dL   BUN 31 (H) 8 - 23 mg/dL   Creatinine, Ser 3.01 0.44 - 1.00 mg/dL   Calcium 7.9 (L) 8.9 - 10.3 mg/dL   GFR calc non Af Amer >60 >60 mL/min   GFR calc Af Amer >60 >60 mL/min   Anion gap 10 5 - 15  Glucose, capillary     Status: Abnormal   Collection Time: 04/22/18  7:38 AM  Result Value Ref Range   Glucose-Capillary 138 (H) 70 - 99 mg/dL     Assessment/Plan:   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 C1-2 FXs- collar per Dr. Yetta Barre B rib FX (R 2-9; L2, 4-7) with small R PTX Acute hypoxic ventilator dependent respiratory failure- failed extubation 1/20. con't to wean as able. Goals of care. Guaifenesin for secretions. HTN- Lopressor Acute urinary retention- on urecholine, improved and having appropriate uop with wick. ID- Ancef fore coli PNAto stopped1/30. Tmax 100.1, no leukocytosis ABL anemia- hgb stable, follow L ulna and 4th  finger FX -ORIF L ulna 1/17 Dr. Jena GaussHaddix  R scapula FX- per Dr. Jena GaussHaddix B tibial plateau FX- ORIF L side 1/17 Dr. Jena GaussHaddix  R fibula FX- per Dr. Jena GaussHaddix R bimal ankle FX- ORIF 1/17 Dr. Jena GaussHaddix Hyperglycemia- SSI HX schizophrenia per family- has not been on meds since last summer VTE- plexipulse,Lovenox FEN-TF Dispo- ICU, prognosis for further recovery doubtful. Await family decision about terminal extubation    LOS: 18 days   Additional  comments:none   Critical Care Total Time 33 minutes   Maisie Fushomas A Adriann Thau 04/22/2018  *Care during the described time interval was provided by me and/or other providers on the critical care team.  I have reviewed this patient's available data, including medical history, events of note, physical examination and test results as part of my evaluation.

## 2018-04-22 NOTE — Progress Notes (Signed)
Orthopedic Tech Progress Note Patient Details:  Renee [name] 1945/08/11 [id number]  Ortho Devices Type of Ortho Device: Volar splint Ortho Device/Splint Location: Left Arm  Ortho Device/Splint Interventions: Ordered, Application, Adjustment   Post Interventions Patient Tolerated: Well Instructions Provided: Adjustment of device, Care of device   Renee [name] J [name] 04/22/2018, 2:21 PM

## 2018-04-22 NOTE — Progress Notes (Signed)
LUE splint soiled with urine from incontinent episode. Paged Ortho MD. Orders to page ortho tech to change splint.

## 2018-04-23 LAB — GLUCOSE, CAPILLARY
GLUCOSE-CAPILLARY: 160 mg/dL — AB (ref 70–99)
GLUCOSE-CAPILLARY: 99 mg/dL (ref 70–99)
Glucose-Capillary: 135 mg/dL — ABNORMAL HIGH (ref 70–99)
Glucose-Capillary: 124 mg/dL — ABNORMAL HIGH (ref 70–99)
Glucose-Capillary: 121 mg/dL — ABNORMAL HIGH (ref 70–99)
Glucose-Capillary: 149 mg/dL — ABNORMAL HIGH (ref 70–99)

## 2018-04-23 MED ORDER — CLONAZEPAM 0.125 MG PO TBDP: 0.2500 mg | ORAL_TABLET | Freq: Every day | ORAL | Status: DC

## 2018-04-23 NOTE — Progress Notes (Signed)
Patient ID: Renee [name], female   DOB: 06-26-1945, 73 y.o.   MRN: [id number] I called her sister to get an update on their decision. Left VM.  Violeta Gelinas, MD, MPH, FACS Trauma: [phone] General Surgery: [phone]

## 2018-04-23 NOTE — Progress Notes (Signed)
Patient ID: Renee [name], female   DOB: Jul 01, 1945, 73 y.o.   MRN: [id number] Follow up - Trauma Critical Care  Patient Details:    Renee [name] is an 73 y.o. female.  Lines/tubes : Airway 7.5 mm (Active)  Secured at (cm) 23 cm 04/23/2018  3:56 AM  Measured From Lips 04/23/2018  3:56 AM  Secured Location Left 04/23/2018  3:56 AM  Secured By [employer] 04/23/2018  3:56 AM  Tube Holder Repositioned Yes 04/23/2018  3:56 AM  Cuff Pressure (cm H2O) 24 cm H2O 04/22/2018  8:01 PM  Site Condition Dry 04/22/2018  8:00 PM     PICC Double Lumen 04/17/18 PICC Right 45 cm 1 cm (Active)  Indication for Insertion or Continuance of Line Prolonged intravenous therapies 04/22/2018  8:00 PM  Exposed Catheter (cm) 1 cm 04/18/2018  8:00 AM  Site Assessment Clean;Dry;Intact 04/22/2018  8:00 PM  Lumen #1 Status Infusing 04/22/2018  8:00 PM  Lumen #2 Status In-line blood sampling system in place 04/22/2018  8:00 PM  Dressing Type Transparent;Occlusive 04/22/2018  8:00 PM  Dressing Status Clean;Dry;Intact;Antimicrobial disc in place 04/22/2018  8:00 PM  Line Care Lumen 1 tubing changed 04/23/2018  7:00 AM  Dressing Change Due 04/24/18 04/22/2018  8:00 PM     NG/OG Tube Orogastric Center mouth Xray (Active)  External Length of Tube (cm) - (if applicable) 62 cm 04/22/2018  8:00 PM  Site Assessment Clean;Dry 04/22/2018  8:00 PM  Ongoing Placement Verification No change in cm markings or external length of tube from initial placement;No change in respiratory status;No acute changes, not attributed to clinical condition 04/22/2018  8:00 PM  Status Infusing tube feed 04/22/2018  8:00 PM  Intake (mL) 60 mL 04/22/2018  6:00 PM     External Urinary Catheter (Active)  Collection Container Dedicated Suction Canister 04/22/2018  8:00 PM  Securement Method Tape 04/22/2018  4:00 AM  Intervention Equipment Changed 04/23/2018  6:00 AM  Output (mL) 600 mL 04/22/2018  6:00 PM    Microbiology/Sepsis markers: Results for orders placed or  performed during the hospital encounter of 04/04/2018  Urine culture     Status: None   Collection Time: 04/04/18  1:42 AM  Result Value Ref Range Status   Specimen Description URINE, CATHETERIZED  Final   Special Requests NONE  Final   Culture   Final    NO GROWTH Performed at [facility], [address], [place], [place] [address]    Report Status Apr 23, 2018 FINAL  Final  MRSA PCR Screening     Status: Abnormal   Collection Time: 04/04/18  3:18 AM  Result Value Ref Range Status   MRSA by PCR POSITIVE (A) NEGATIVE Final    Comment:        The GeneXpert MRSA Assay (FDA approved for NASAL specimens only), is one component of a comprehensive MRSA colonization surveillance program. It is not intended to diagnose MRSA infection nor to guide or monitor treatment for MRSA infections. RESULT CALLED TO, READ BACK BY AND VERIFIED WITH: [name] RN [id number] 04/04/18 [name] Performed at [facility], [address] [place], [place] [address]   Culture, respiratory (non-expectorated)     Status: None   Collection Time: 04/09/18  7:44 AM  Result Value Ref Range Status   Specimen Description TRACHEAL ASPIRATE  Final   Special Requests Normal  Final   Gram Stain   Final    MODERATE WBC PRESENT, PREDOMINANTLY PMN FEW  GRAM NEGATIVE RODS RARE GRAM POSITIVE COCCI IN CLUSTERS Performed at [facility], [address] [place], [place] [address]    Culture FEW ESCHERICHIA COLI  Final   Report Status 04/11/2018 FINAL  Final   Organism ID, Bacteria ESCHERICHIA COLI  Final      Susceptibility   Escherichia coli - MIC*    AMPICILLIN 8 SENSITIVE Sensitive     CEFAZOLIN <=4 SENSITIVE Sensitive     CEFEPIME <=1 SENSITIVE Sensitive     CEFTAZIDIME <=1 SENSITIVE Sensitive     CEFTRIAXONE <=1 SENSITIVE Sensitive     CIPROFLOXACIN <=0.25 SENSITIVE Sensitive     GENTAMICIN <=1 SENSITIVE Sensitive     IMIPENEM <=0.25 SENSITIVE Sensitive     TRIMETH/SULFA <=20 SENSITIVE Sensitive      AMPICILLIN/SULBACTAM 4 SENSITIVE Sensitive     PIP/TAZO <=4 SENSITIVE Sensitive     Extended ESBL NEGATIVE Sensitive     * FEW ESCHERICHIA COLI    Anti-infectives:  Anti-infectives (From admission, onward)   Start     Dose/Rate Route Frequency Ordered Stop   04/12/18 1800  ceFAZolin (ANCEF) IVPB 1 g/50 mL premix     1 g 100 mL/hr over 30 Minutes Intravenous Every 8 hours 04/12/18 1300 04/18/18 0159   04/11/18 1000  ceFEPIme (MAXIPIME) 1 g in sodium chloride 0.9 % 100 mL IVPB  Status:  Discontinued     1 g 200 mL/hr over 30 Minutes Intravenous Every 12 hours 04/11/18 0732 04/12/18 1300   February 13, 2019 1800  ceFAZolin (ANCEF) IVPB 2g/100 mL premix     2 g 200 mL/hr over 30 Minutes Intravenous Every 8 hours February 13, 2019 1609 04/06/18 1520   February 13, 2019 1258  vancomycin (VANCOCIN) powder  Status:  Discontinued       As needed February 13, 2019 1258 February 13, 2019 1534   February 13, 2019 1000  vancomycin (VANCOCIN) IVPB 1000 mg/200 mL premix     1,000 mg 200 mL/hr over 60 Minutes Intravenous To ShortStay Surgical 04/04/18 1257 February 13, 2019 1242   February 13, 2019 0930  ceFAZolin (ANCEF) IVPB 2g/100 mL premix  Status:  Discontinued     2 g 200 mL/hr over 30 Minutes Intravenous To ShortStay Surgical February 13, 2019 0712 February 13, 2019 1540   04/04/18 0930  ceFAZolin (ANCEF) IVPB 1 g/50 mL premix  Status:  Discontinued     1 g 100 mL/hr over 30 Minutes Intravenous Every 8 hours 04/04/18 0840 February 13, 2019 1609      Best Practice/Protocols:  VTE Prophylaxis: Lovenox (prophylaxtic dose) Continous Sedation  Consults: Treatment Team:  Tia AlertJones, David S, MD Haddix, Gillie MannersKevin P, MD    Studies:    Events:  Subjective:    Overnight Issues:   Objective:  Vital signs for last 24 hours: Temp:  [99.3 F (37.4 C)-100.1 F (37.8 C)] 99.8 F (37.7 C) (02/04 0400) Pulse Rate:  [77-108] 88 (02/04 0700) Resp:  [12-30] 16 (02/04 0700) BP: (106-169)/(48-122) 142/58 (02/04 0700) SpO2:  [98 %-100 %] 100 % (02/04 0700) FiO2 (%):  [30 %] 30 % (02/04  0400)  Hemodynamic parameters for last 24 hours:    Intake/Output from previous day: 02/03 0701 - 02/04 0700 In: 2277.4 [I.V.:387.4; NG/GT:1890] Out: 1140 [Urine:1140]  Intake/Output this shift: No intake/output data recorded.  Vent settings for last 24 hours: Vent Mode: PRVC FiO2 (%):  [30 %] 30 % Set Rate:  [15 bmp] 15 bmp Vt Set:  [540 mL] 540 mL PEEP:  [5 cmH20] 5 cmH20 Pressure Support:  [10 cmH20] 10 cmH20 Plateau Pressure:  [10 cmH20-17 cmH20] 15  cmH20  Physical Exam:  General: on vent Neuro: opens eyes, grimaces, not F/C HEENT/Neck: ETT and collar Resp: clear to auscultation bilaterally CVS: RRR GI: soft, NT, +BS Extremities: edema 1+ and ortho dressings, brace off  Results for orders placed or performed during the hospital encounter of 04/19/2018 (from the past 24 hour(s))  Glucose, capillary     Status: Abnormal   Collection Time: 04/22/18  7:38 AM  Result Value Ref Range   Glucose-Capillary 138 (H) 70 - 99 mg/dL  Glucose, capillary     Status: Abnormal   Collection Time: 04/22/18 11:53 AM  Result Value Ref Range   Glucose-Capillary 116 (H) 70 - 99 mg/dL   Comment 1 Notify RN    Comment 2 Document in Chart   Glucose, capillary     Status: Abnormal   Collection Time: 04/22/18  3:37 PM  Result Value Ref Range   Glucose-Capillary 148 (H) 70 - 99 mg/dL  Glucose, capillary     Status: Abnormal   Collection Time: 04/22/18  7:37 PM  Result Value Ref Range   Glucose-Capillary 140 (H) 70 - 99 mg/dL  Glucose, capillary     Status: Abnormal   Collection Time: 04/22/18 11:17 PM  Result Value Ref Range   Glucose-Capillary 161 (H) 70 - 99 mg/dL  Glucose, capillary     Status: Abnormal   Collection Time: 04/23/18  3:25 AM  Result Value Ref Range   Glucose-Capillary 135 (H) 70 - 99 mg/dL    Assessment & Plan: Present on Admission: **None**    LOS: 19 days   Additional comments:I reviewed the patient's new clinical lab test results. 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 C1-2 FXs- collar per Dr. Yetta Barre B rib FX (R 2-9; L2, 4-7) with small R PTX Acute hypoxic ventilator dependent respiratory failure- failed extubation 1/20. con't to wean as able. Goals of care. Guaifenesin for secretions. HTN- Lopressor Acute urinary retention- on urecholine, improved and having appropriate uop with wick. ABL anemia L ulna and 4th finger FX -ORIF L ulna 1/17 Dr. Jena Gauss  R scapula FX- per Dr. Jena Gauss B tibial plateau FX- ORIF L side 1/17 Dr. Jena Gauss  R fibula FX- per Dr. Jena Gauss R bimal ankle FX- ORIF 1/17 Dr. Jena Gauss Hyperglycemia- SSI HX schizophrenia per family- has not been on meds since last summer VTE- plexipulse,Lovenox FEN-TF, lab holliday tomorrow Dispo- ICU, prognosis for further recovery very poor. Await family decision about terminal extubation - they were supposed to decide over the weekend. I will call them again today.  Critical Care Total Time*: 36 Minutes  Violeta Gelinas, MD, MPH, [facility] Trauma: [phone] General Surgery: [phone]  04/23/2018  *Care during the described time interval was provided by me. I have reviewed this patient's available data, including medical history, events of note, physical examination and test results as part of my evaluation.

## 2018-04-23 NOTE — Progress Notes (Signed)
Patient ID: Renee [name], female   DOB: 19-Oct-1945, 73 y.o.   MRN: [id number] I spoke with her niece, [place], and the family will all be coming Friday to the bedside and will make their final decision about goals of care then.  Violeta Gelinas, MD, MPH, FACS Trauma: [phone] General Surgery: [phone]

## 2018-04-24 ENCOUNTER — Inpatient Hospital Stay (HOSPITAL_COMMUNITY): Payer: Medicare Other

## 2018-04-24 LAB — GLUCOSE, CAPILLARY
GLUCOSE-CAPILLARY: 122 mg/dL — AB (ref 70–99)
Glucose-Capillary: 110 mg/dL — ABNORMAL HIGH (ref 70–99)
Glucose-Capillary: 145 mg/dL — ABNORMAL HIGH (ref 70–99)
Glucose-Capillary: 149 mg/dL — ABNORMAL HIGH (ref 70–99)
Glucose-Capillary: 104 mg/dL — ABNORMAL HIGH (ref 70–99)
Glucose-Capillary: 97 mg/dL (ref 70–99)

## 2018-04-24 MED ORDER — CLONAZEPAM 0.5 MG PO TBDP
0.5000 mg | ORAL_TABLET | Freq: Two times a day (BID) | ORAL | Status: DC
  Administered 2018-04-24 – 2018-04-26 (×5): 0.5 mg
  Filled 2018-04-24 (×5): qty 1

## 2018-04-24 NOTE — Progress Notes (Signed)
Patient ID: Renee [name], female   DOB: [birth year], 73 y.o.   MRN: [id number] Follow up - Trauma Critical Care  Patient Details:    Renee [name] is an 73 y.o. female.  Lines/tubes : Airway 7.5 mm (Active)  Secured at (cm) 23 cm 04/24/2018 12:01 AM  Measured From Lips 04/24/2018 12:01 AM  Secured Location Center 04/24/2018 12:01 AM  Secured By [employer] 04/24/2018 12:01 AM  Tube Holder Repositioned Yes 04/24/2018 12:01 AM  Cuff Pressure (cm H2O) 26 cm H2O 04/23/2018  8:22 PM  Site Condition Dry 04/23/2018  3:50 PM     PICC Double Lumen 04/17/18 PICC Right 45 cm 1 cm (Active)  Indication for Insertion or Continuance of Line Prolonged intravenous therapies 04/23/2018  8:00 PM  Exposed Catheter (cm) 1 cm 04/18/2018  8:00 AM  Site Assessment Clean;Dry;Intact 04/23/2018  8:00 PM  Lumen #1 Status Infusing 04/23/2018  8:00 PM  Lumen #2 Status In-line blood sampling system in place 04/23/2018  8:00 PM  Dressing Type Transparent;Occlusive 04/23/2018  8:00 PM  Dressing Status Clean;Dry;Intact;Antimicrobial disc in place 04/23/2018  8:00 PM  Line Care Connections checked and tightened 04/23/2018  8:00 PM  Dressing Intervention Dressing changed 04/24/2018  2:00 AM  Dressing Change Due 05/01/18 04/24/2018  2:00 AM     NG/OG Tube Orogastric Center mouth Xray (Active)  External Length of Tube (cm) - (if applicable) 62 cm 04/23/2018  8:00 PM  Site Assessment Clean;Intact;Dry 04/23/2018  8:00 PM  Ongoing Placement Verification No change in cm markings or external length of tube from initial placement;No change in respiratory status;No acute changes, not attributed to clinical condition 04/23/2018  8:00 PM  Status Infusing tube feed 04/23/2018  8:00 PM  Intake (mL) 60 mL 04/23/2018 12:00 PM     External Urinary Catheter (Active)  Collection Container Dedicated Suction Canister 04/23/2018  8:00 PM  Securement Method Tape 04/23/2018  8:00 PM  Intervention Equipment Changed 04/23/2018  8:00 PM  Output (mL) 400 mL 04/24/2018   4:00 AM    Microbiology/Sepsis markers: Results for orders placed or performed during the hospital encounter of 03/28/2018  Urine culture     Status: None   Collection Time: 04/04/18  1:42 AM  Result Value Ref Range Status   Specimen Description URINE, CATHETERIZED  Final   Special Requests NONE  Final   Culture   Final    NO GROWTH Performed at [facility], [address], [place], [place] [address]    Report Status 2018-04-11 FINAL  Final  MRSA PCR Screening     Status: Abnormal   Collection Time: 04/04/18  3:18 AM  Result Value Ref Range Status   MRSA by PCR POSITIVE (A) NEGATIVE Final    Comment:        The GeneXpert MRSA Assay (FDA approved for NASAL specimens only), is one component of a comprehensive MRSA colonization surveillance program. It is not intended to diagnose MRSA infection nor to guide or monitor treatment for MRSA infections. RESULT CALLED TO, READ BACK BY AND VERIFIED WITH: [name] RN [id number] 04/04/18 [name] Performed at [facility], [address], [place], [place] [address]   Culture, respiratory (non-expectorated)     Status: None   Collection Time: 04/09/18  7:44 AM  Result Value Ref Range Status   Specimen Description TRACHEAL ASPIRATE  Final   Special Requests Normal  Final   Gram Stain   Final    MODERATE WBC PRESENT,  PREDOMINANTLY PMN FEW GRAM NEGATIVE RODS RARE GRAM POSITIVE COCCI IN CLUSTERS Performed at [facility], [address] [place], [place] [address]    Culture FEW ESCHERICHIA COLI  Final   Report Status 04/11/2018 FINAL  Final   Organism ID, Bacteria ESCHERICHIA COLI  Final      Susceptibility   Escherichia coli - MIC*    AMPICILLIN 8 SENSITIVE Sensitive     CEFAZOLIN <=4 SENSITIVE Sensitive     CEFEPIME <=1 SENSITIVE Sensitive     CEFTAZIDIME <=1 SENSITIVE Sensitive     CEFTRIAXONE <=1 SENSITIVE Sensitive     CIPROFLOXACIN <=0.25 SENSITIVE Sensitive     GENTAMICIN <=1 SENSITIVE Sensitive      IMIPENEM <=0.25 SENSITIVE Sensitive     TRIMETH/SULFA <=20 SENSITIVE Sensitive     AMPICILLIN/SULBACTAM 4 SENSITIVE Sensitive     PIP/TAZO <=4 SENSITIVE Sensitive     Extended ESBL NEGATIVE Sensitive     * FEW ESCHERICHIA COLI    Anti-infectives:  Anti-infectives (From admission, onward)   Start     Dose/Rate Route Frequency Ordered Stop   04/12/18 1800  ceFAZolin (ANCEF) IVPB 1 g/50 mL premix     1 g 100 mL/hr over 30 Minutes Intravenous Every 8 hours 04/12/18 1300 04/18/18 0159   04/11/18 1000  ceFEPIme (MAXIPIME) 1 g in sodium chloride 0.9 % 100 mL IVPB  Status:  Discontinued     1 g 200 mL/hr over 30 Minutes Intravenous Every 12 hours 04/11/18 0732 04/12/18 1300   04/07/2018 1800  ceFAZolin (ANCEF) IVPB 2g/100 mL premix     2 g 200 mL/hr over 30 Minutes Intravenous Every 8 hours 03/22/2018 1609 04/06/18 1520   03/20/2018 1258  vancomycin (VANCOCIN) powder  Status:  Discontinued       As needed 04/07/2018 1258 03/29/2018 1534   04/02/2018 1000  vancomycin (VANCOCIN) IVPB 1000 mg/200 mL premix     1,000 mg 200 mL/hr over 60 Minutes Intravenous To ShortStay Surgical 04/04/18 1257 04/17/2018 1242   03/26/2018 0930  ceFAZolin (ANCEF) IVPB 2g/100 mL premix  Status:  Discontinued     2 g 200 mL/hr over 30 Minutes Intravenous To ShortStay Surgical 03/31/2018 0712 03/30/2018 1540   04/04/18 0930  ceFAZolin (ANCEF) IVPB 1 g/50 mL premix  Status:  Discontinued     1 g 100 mL/hr over 30 Minutes Intravenous Every 8 hours 04/04/18 0840 04/13/2018 1609      Best Practice/Protocols:  VTE Prophylaxis: Lovenox (prophylaxtic dose) Intermittent Sedation  Consults: Treatment Team:  Tia AlertJones, David S, MD Haddix, Gillie MannersKevin P, MD    Studies:    Events:  Subjective:    Overnight Issues:   Objective:  Vital signs for last 24 hours: Temp:  [98.7 F (37.1 C)-100.2 F (37.9 C)] 100.2 F (37.9 C) (02/05 0400) Pulse Rate:  [70-127] 78 (02/05 0600) Resp:  [15-41] 16 (02/05 0600) BP: (126-173)/(47-87) 131/54  (02/05 0600) SpO2:  [100 %] 100 % (02/05 0600) FiO2 (%):  [30 %] 30 % (02/05 0400)  Hemodynamic parameters for last 24 hours:    Intake/Output from previous day: 02/04 0701 - 02/05 0700 In: 1892.2 [I.V.:432.2; NG/GT:1460] Out: 1250 [Urine:1250]  Intake/Output this shift: No intake/output data recorded.  Vent settings for last 24 hours: Vent Mode: PRVC FiO2 (%):  [30 %] 30 % Set Rate:  [15 bmp] 15 bmp Vt Set:  [540 mL] 540 mL PEEP:  [5 cmH20] 5 cmH20 Pressure Support:  [5 cmH20] 5 cmH20 Plateau Pressure:  [11 cmH20-15  cmH20] 15 cmH20  Physical Exam:  General: on vent Neuro: opens eyes, does not F/C HEENT/Neck: ETT and collar Resp: clear to auscultation bilaterally CVS: RRR GI: soft, NT Extremities: edema 1+ and splints  Results for orders placed or performed during the hospital encounter of 03/27/2018 (from the past 24 hour(s))  Glucose, capillary     Status: Abnormal   Collection Time: 04/23/18 11:35 AM  Result Value Ref Range   Glucose-Capillary 160 (H) 70 - 99 mg/dL  Glucose, capillary     Status: None   Collection Time: 04/23/18  3:29 PM  Result Value Ref Range   Glucose-Capillary 99 70 - 99 mg/dL   Comment 1 Notify RN    Comment 2 Document in Chart   Glucose, capillary     Status: Abnormal   Collection Time: 04/23/18  7:24 PM  Result Value Ref Range   Glucose-Capillary 121 (H) 70 - 99 mg/dL  Glucose, capillary     Status: Abnormal   Collection Time: 04/23/18 11:15 PM  Result Value Ref Range   Glucose-Capillary 149 (H) 70 - 99 mg/dL  Glucose, capillary     Status: Abnormal   Collection Time: 04/24/18  3:24 AM  Result Value Ref Range   Glucose-Capillary 110 (H) 70 - 99 mg/dL    Assessment & Plan: Present on Admission: **None**    LOS: 20 days   Additional comments:I reviewed the patient's new clinical lab test results. [facility]/SAH- per Dr. Yetta BarreJones, F/U CT head 1/30with some increase in hygroma and redemonstration of unstable C1-2 fx. C1-2  FXs- collar per Dr. Yetta BarreJones B rib FX (R 2-9; L2, 4-7) with small R PTX Acute hypoxic ventilator dependent respiratory failure- failed extubation 1/20. con't to wean as able. Goals of care. Guaifenesin for secretions. HTN- Lopressor Acute urinary retention- on urecholine, improved and having appropriate uop with wick. ID- Ancef fore coli PNAto stopped 1/30. Tmax 100.1, no leukocytosis ABL anemia- hgb stable, follow L ulna and 4th finger FX -ORIF L ulna 1/17 Dr. Jena GaussHaddix  R scapula FX- per Dr. Jena GaussHaddix B tibial plateau FX- ORIF L side 1/17 Dr. Jena GaussHaddix  R fibula FX- per Dr. Jena GaussHaddix R bimal ankle FX- ORIF 1/17 Dr. Jena GaussHaddix Hyperglycemia- SSI HX schizophrenia per family- has not been on meds since last summer VTE- plexipulse,Lovenox FEN-TF, BMET in AM, increase Klonopin due to agitation Dispo- ICU, prognosis for further recovery low. Family coming Friday for likely extubation with no reintubation. Critical Care Total Time*: 3133 Minutes  Violeta GelinasBurke Chester Sibert, MD, MPH, [facility] Trauma: [phone] General Surgery: [phone]  04/24/2018  *Care during the described time interval was provided by me. I have reviewed this patient's available data, including medical history, events of note, physical examination and test results as part of my evaluation.

## 2018-04-24 NOTE — Progress Notes (Addendum)
Nutrition Follow-up  DOCUMENTATION CODES:   Not applicable  INTERVENTION:   Continue Pivot 1.5 @ 45 ml/hr via OG tube 30 ml Prostat BID FWF 200 ml Q8 hours  Provides: 1820 kcal, 131 grams protein, and 819 ml free water Total free water: 1419 ml   NUTRITION DIAGNOSIS:   Increased nutrient needs related to (trauma) as evidenced by estimated needs.  Ongoing  GOAL:   Patient will meet greater than or equal to 90% of their needs  Met with TF  MONITOR:   Vent status, I & O's  REASON FOR ASSESSMENT:   Ventilator    ASSESSMENT:   Pt with PMH of schizophrenia admitted as a PHBC with TBI/SAH, C1-2 fx (in collar), B rib fxs with small R PTX, L ulna and 4th finger fx, R scapula fx, B tibial plateau fx, R fibula fx, and R bimal ankle fx.    1/17 to OR for repair of L ulna, 4th finger, B tibial pleateau fx, R bimal ankle fx 1/20 failed extubation   Per trauma, family coming in friday for possible terminal extubation. Will continue TF until then. Plan discussed with RN.   A weight has not been obtained since 1/26. Recommend checking daily weights to monitor trends.   Patient is currently intubated on ventilator support MV: 10.4 L/min Temp (24hrs), Avg:99.7 F (37.6 C), Min:98.7 F (37.1 C), Max:100.2 F (37.9 C) BP: 170/64 MAP: 91  I/O: +1163 ml since admit UOP: 1250 ml x 24 hrs  Medications reviewed and include: SSI novolog, urecholine, miralax Labs reviewed: K 3.4 (L) CBG 115-161  Diet Order:   Diet Order    None      EDUCATION NEEDS:   No education needs have been identified at this time  Skin:  Skin Assessment: Skin Integrity Issues: Skin Integrity Issues:: Stage II, Incisions, Other (Comment) Stage II: coccyx Incisions: bilateral legs, left arm Other: left head laceration   Last BM:  2/5 x5  Height:   Ht Readings from Last 1 Encounters:  03/21/2018 5' 10" (1.778 m)    Weight:   Wt Readings from Last 1 Encounters:  04/14/18 87.5 kg     Ideal Body Weight:  68.1 kg  BMI:  Body mass index is 27.68 kg/m.  Estimated Nutritional Needs:   Kcal:  1863  Protein:  122-145 grams  Fluid:  > 1.9 L/day     RD, LDN Clinical Nutrition Pager # - [phone]  

## 2018-04-25 LAB — GLUCOSE, CAPILLARY
GLUCOSE-CAPILLARY: 128 mg/dL — AB (ref 70–99)
Glucose-Capillary: 121 mg/dL — ABNORMAL HIGH (ref 70–99)
Glucose-Capillary: 150 mg/dL — ABNORMAL HIGH (ref 70–99)
Glucose-Capillary: 117 mg/dL — ABNORMAL HIGH (ref 70–99)
Glucose-Capillary: 119 mg/dL — ABNORMAL HIGH (ref 70–99)
Glucose-Capillary: 166 mg/dL — ABNORMAL HIGH (ref 70–99)

## 2018-04-25 LAB — BASIC METABOLIC PANEL
Anion gap: 10 (ref 5–15)
BUN: 21 mg/dL (ref 8–23)
CHLORIDE: 105 mmol/L (ref 98–111)
CO2: 26 mmol/L (ref 22–32)
Calcium: 8.7 mg/dL — ABNORMAL LOW (ref 8.9–10.3)
Creatinine, Ser: 0.72 mg/dL (ref 0.44–1.00)
GFR calc Af Amer: >60 mL/min (ref >60)
GFR calc non Af Amer: >60 mL/min (ref >60)
Glucose, Bld: 168 mg/dL — ABNORMAL HIGH (ref 70–99)
Potassium: 3.8 mmol/L (ref 3.5–5.1)
Sodium: 141 mmol/L (ref 135–145)
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
## 2018-04-26 LAB — GLUCOSE, CAPILLARY
Glucose-Capillary: 132 mg/dL — ABNORMAL HIGH (ref 70–99)
Glucose-Capillary: 122 mg/dL — ABNORMAL HIGH (ref 70–99)
Glucose-Capillary: 141 mg/dL — ABNORMAL HIGH (ref 70–99)
Glucose-Capillary: 104 mg/dL — ABNORMAL HIGH (ref 70–99)

## 2018-04-26 MED ORDER — POLYVINYL ALCOHOL 1.4 % OP SOLN: 1.0000 [drp] | Freq: Four times a day (QID) | OPHTHALMIC | Status: DC | PRN

## 2018-04-26 MED ORDER — MORPHINE BOLUS VIA INFUSION
1.0000 mg | INTRAVENOUS | Status: DC | PRN
  Filled 2018-04-26: qty 1

## 2018-04-26 MED ORDER — ONDANSETRON HCL 4 MG/2ML IJ SOLN: 4.0000 mg | Freq: Four times a day (QID) | INTRAMUSCULAR | Status: DC | PRN

## 2018-04-26 MED ORDER — DEXTROSE 5 % IV SOLN: INTRAVENOUS | Status: DC

## 2018-04-26 MED ORDER — BIOTENE DRY MOUTH MT LIQD: 15.0000 mL | OROMUCOSAL | Status: DC | PRN

## 2018-04-26 MED ORDER — POLYVINYL ALCOHOL 1.4 % OP SOLN
1.0000 [drp] | Freq: Four times a day (QID) | OPHTHALMIC | Status: DC | PRN
  Filled 2018-04-26: qty 15

## 2018-04-26 MED ORDER — ACETAMINOPHEN 650 MG RE SUPP: 650.0000 mg | Freq: Four times a day (QID) | RECTAL | Status: DC | PRN

## 2018-04-26 MED ORDER — GLYCOPYRROLATE 0.2 MG/ML IJ SOLN: 0.2000 mg | INTRAMUSCULAR | Status: DC | PRN

## 2018-04-26 MED ORDER — GLYCOPYRROLATE 1 MG PO TABS
1.0000 mg | ORAL_TABLET | ORAL | Status: DC | PRN
  Filled 2018-04-26: qty 1

## 2018-04-26 MED ORDER — GLYCOPYRROLATE 0.2 MG/ML IJ SOLN
0.2000 mg | INTRAMUSCULAR | Status: DC | PRN
  Administered 2018-04-26: 0.2 mg via INTRAVENOUS
  Filled 2018-04-26: qty 1

## 2018-04-26 MED ORDER — ACETAMINOPHEN 325 MG PO TABS: 650.0000 mg | ORAL_TABLET | Freq: Four times a day (QID) | ORAL | Status: DC | PRN

## 2018-04-26 MED ORDER — DIPHENHYDRAMINE HCL 50 MG/ML IJ SOLN: 25.0000 mg | INTRAMUSCULAR | Status: DC | PRN

## 2018-04-26 MED ORDER — ONDANSETRON 4 MG PO TBDP: 4.0000 mg | ORAL_TABLET | Freq: Four times a day (QID) | ORAL | Status: DC | PRN

## 2018-04-26 MED ORDER — MORPHINE 100MG IN NS 100ML (1MG/ML) PREMIX INFUSION
1.0000 mg/h | INTRAVENOUS | Status: DC
  Administered 2018-04-26: 2 mg/h via INTRAVENOUS
  Filled 2018-04-26 (×2): qty 100

## 2018-05-19 NOTE — Accreditation Note (Signed)
Restraints not reported to CMS Pursuant to regulation 482.13 (G) (3) use of soft wrist restraints was logged on 04/29/2018 @ 1439

## 2018-05-19 NOTE — Progress Notes (Signed)
Patient ID: Renee [name], female   DOB: [birth year], 73 y.o.   MRN: [id number] Called by [facility] nursing. Family is here and wants to withdraw care and terminally extubate.  I am about to start operating so cannot go up there.  Has been discussed with trauma team quite a bit

## 2018-05-19 NOTE — Procedures (Signed)
Extubation Procedure Note  Patient Details:   Name: Renee BeckwithLena Kelly [name] DOB: 09/30/1945 MRN: [id number]   Airway Documentation:    Vent end date: February 25, 2019 Vent end time: 1855   Evaluation  O2 sats: currently acceptable Complications: No apparent complications Patient did tolerate procedure well. Bilateral Breath Sounds: Rhonchi, Diminished   No   Pt extubated to RA per Withdrawal of Life Protocol.  Carolan ShiverKelley, Halia Franey M 04/25/2018, 6:56 PM

## 2018-05-19 NOTE — Progress Notes (Signed)
At 2023, pt's monitor displayed asystole. Verified with Taylar, RN and no breath sounds or heart sounds were auscultated. EKG strip saved to pt's chart. Family at bedside. Wasted 85cc Morphine and 90cc Fentanyl with Taylar, RN. ME and attending physician notified. Post Mortem checklist has been completed.   CDS has been notified, spoke with Joanne Chars.  Referral Number [id number].

## 2018-05-19 NOTE — Progress Notes (Signed)
Family arrived, stating their decision was as previously made with conversations with Dr. Janee Morn; to withdraw ventilator care with no reintubation and withdraw of care. Family clarified they would not want further life sustaining measures taken after extubation.
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
## 2018-05-19 DEATH — deceased
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
## 2019-07-30 IMAGING — DX DG CHEST 1V PORT
1 series · 1 of 1 positions shown · non-contrast
Comparison: Two days ago

CLINICAL DATA: Pleural effusion

EXAM:
PORTABLE CHEST 1 VIEW

[chest]
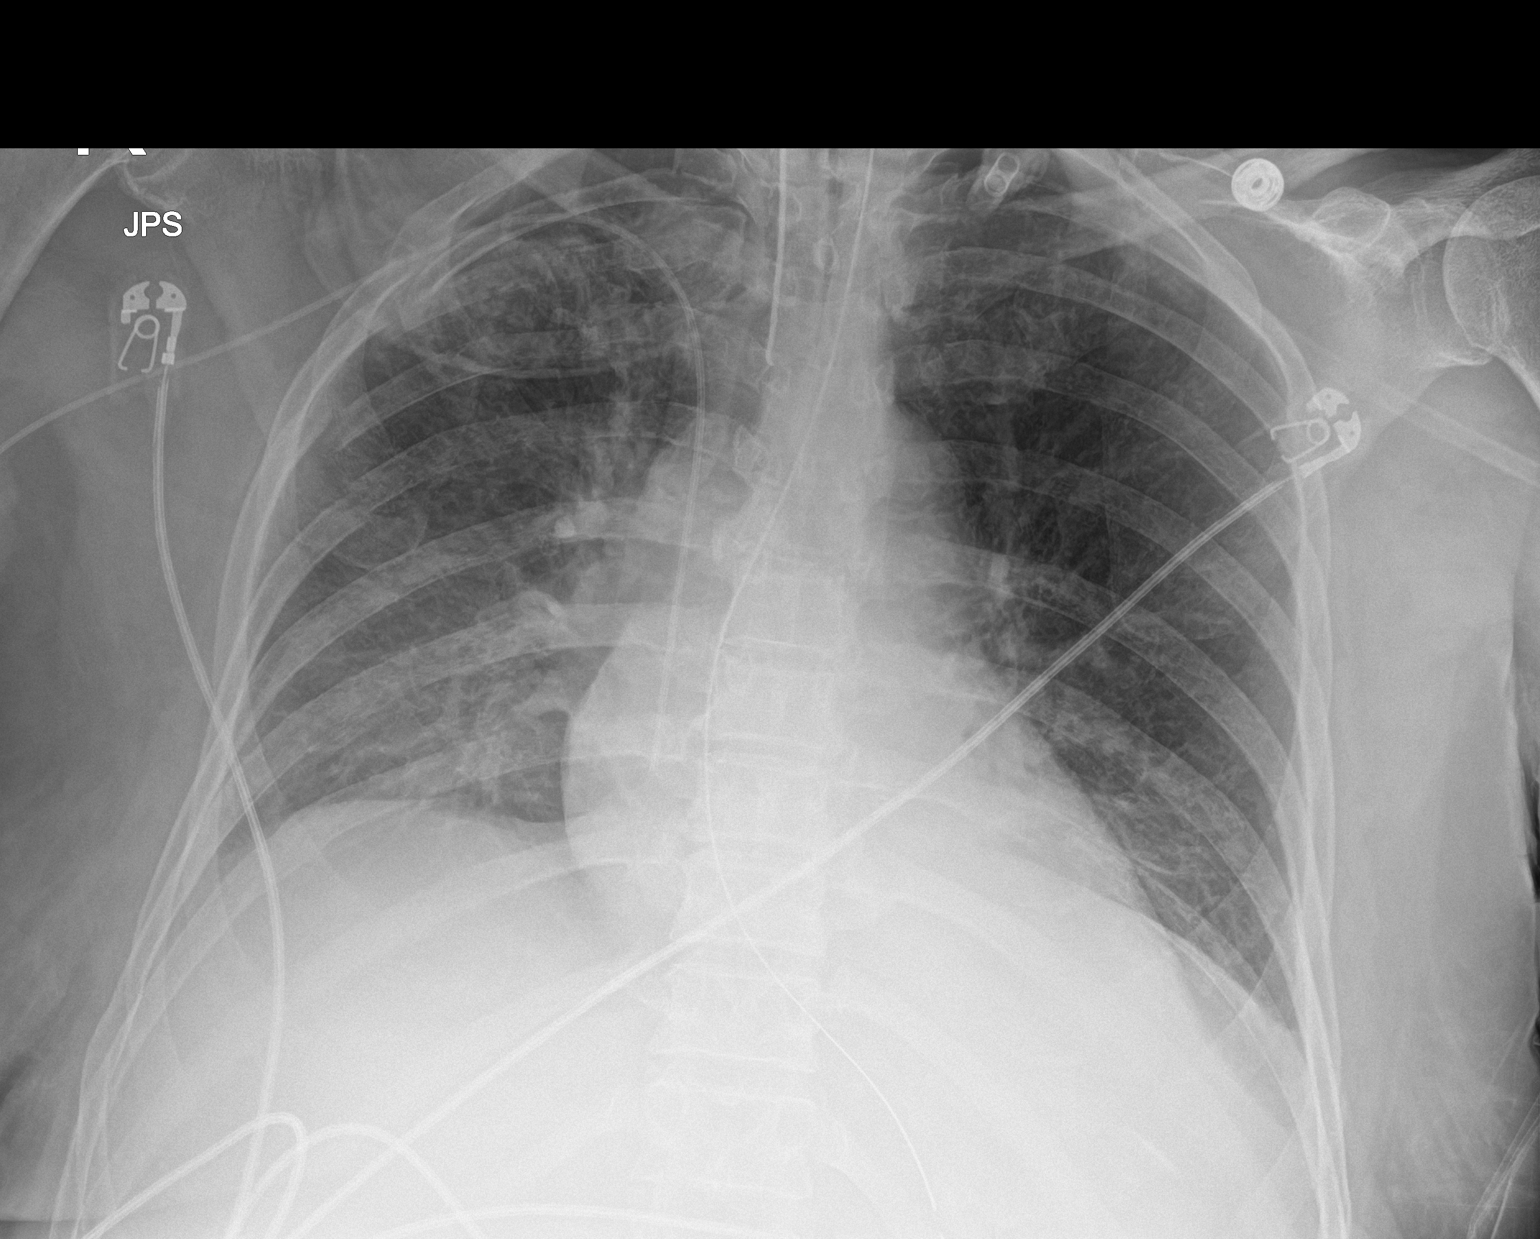

[1 of 1 positions shown; findings below may reference images not displayed]

FINDINGS: Endotracheal tube tip just below the clavicular heads. The
orogastric tube reaches the stomach. Right upper extremity PICC with
tip at the upper right atrium.

Improving aeration with now visible right diaphragm. Retrocardiac
opacity persists, likely atelectasis. Normal heart size. No evident
pneumothorax. Known rib fracture.
IMPRESSION: 1. Improved aeration on the right.
2. Stable hardware positioning.
